# Patient Record
Sex: Female | Born: 1980 | Hispanic: No | Marital: Married | Smoking: Never smoker
Health system: Southern US, Community
[De-identification: ages and names within clinical notes are randomized; demographics above are authoritative.]

## PROBLEM LIST (undated history)

## (undated) ENCOUNTER — Inpatient Hospital Stay (HOSPITAL_COMMUNITY): Payer: Self-pay

## (undated) DIAGNOSIS — O24419 Gestational diabetes mellitus in pregnancy, unspecified control: Secondary | ICD-10-CM

## (undated) DIAGNOSIS — R002 Palpitations: Secondary | ICD-10-CM

## (undated) DIAGNOSIS — M419 Scoliosis, unspecified: Secondary | ICD-10-CM

## (undated) DIAGNOSIS — M545 Low back pain, unspecified: Secondary | ICD-10-CM

## (undated) DIAGNOSIS — F419 Anxiety disorder, unspecified: Secondary | ICD-10-CM

## (undated) DIAGNOSIS — G8929 Other chronic pain: Secondary | ICD-10-CM

## (undated) DIAGNOSIS — M5124 Other intervertebral disc displacement, thoracic region: Secondary | ICD-10-CM

## (undated) DIAGNOSIS — T8859XA Other complications of anesthesia, initial encounter: Secondary | ICD-10-CM

## (undated) HISTORY — DX: Low back pain, unspecified: M54.50

## (undated) HISTORY — DX: Other complications of anesthesia, initial encounter: T88.59XA

## (undated) HISTORY — DX: Palpitations: R00.2

## (undated) HISTORY — DX: Other chronic pain: G89.29

## (undated) HISTORY — DX: Anxiety disorder, unspecified: F41.9

## (undated) HISTORY — DX: Gestational diabetes mellitus in pregnancy, unspecified control: O24.419

## (undated) HISTORY — DX: Other intervertebral disc displacement, thoracic region: M51.24

## (undated) HISTORY — DX: Scoliosis, unspecified: M41.9

---

## 2021-01-24 DIAGNOSIS — Z113 Encounter for screening for infections with a predominantly sexual mode of transmission: Secondary | ICD-10-CM | POA: Insufficient documentation

## 2021-01-24 DIAGNOSIS — Z309 Encounter for contraceptive management, unspecified: Secondary | ICD-10-CM | POA: Insufficient documentation

## 2021-01-24 DIAGNOSIS — Z0289 Encounter for other administrative examinations: Secondary | ICD-10-CM | POA: Insufficient documentation

## 2021-01-24 HISTORY — DX: Encounter for screening for infections with a predominantly sexual mode of transmission: Z11.3

## 2021-01-25 ENCOUNTER — Other Ambulatory Visit: Payer: Self-pay | Admitting: Family Medicine

## 2021-01-25 ENCOUNTER — Other Ambulatory Visit: Payer: Self-pay

## 2021-01-25 ENCOUNTER — Other Ambulatory Visit (HOSPITAL_COMMUNITY)
Admission: RE | Admit: 2021-01-25 | Discharge: 2021-01-25 | Disposition: A | Discharge status: Home / Self Care | Payer: Medicaid Other | Source: Ambulatory Visit | Attending: Family Medicine | Admitting: Family Medicine

## 2021-01-25 ENCOUNTER — Ambulatory Visit (INDEPENDENT_AMBULATORY_CARE_PROVIDER_SITE_OTHER): Payer: Medicaid Other | Admitting: Family Medicine

## 2021-01-25 VITALS — BP 100/68 | HR 76 | Ht 68.0 in | Wt 141.6 lb

## 2021-01-25 DIAGNOSIS — R079 Chest pain, unspecified: Secondary | ICD-10-CM | POA: Diagnosis not present

## 2021-01-25 DIAGNOSIS — G8929 Other chronic pain: Secondary | ICD-10-CM | POA: Diagnosis not present

## 2021-01-25 DIAGNOSIS — Z113 Encounter for screening for infections with a predominantly sexual mode of transmission: Secondary | ICD-10-CM | POA: Diagnosis present

## 2021-01-25 DIAGNOSIS — Z114 Encounter for screening for human immunodeficiency virus [HIV]: Secondary | ICD-10-CM

## 2021-01-25 DIAGNOSIS — R002 Palpitations: Secondary | ICD-10-CM | POA: Insufficient documentation

## 2021-01-25 DIAGNOSIS — K5900 Constipation, unspecified: Secondary | ICD-10-CM | POA: Insufficient documentation

## 2021-01-25 DIAGNOSIS — M545 Low back pain, unspecified: Secondary | ICD-10-CM | POA: Insufficient documentation

## 2021-01-25 DIAGNOSIS — N6320 Unspecified lump in the left breast, unspecified quadrant: Secondary | ICD-10-CM

## 2021-01-25 DIAGNOSIS — Z1159 Encounter for screening for other viral diseases: Secondary | ICD-10-CM

## 2021-01-25 DIAGNOSIS — Z0289 Encounter for other administrative examinations: Secondary | ICD-10-CM | POA: Diagnosis not present

## 2021-01-25 HISTORY — DX: Unspecified lump in the left breast, unspecified quadrant: N63.20

## 2021-01-25 LAB — POCT URINALYSIS DIP (MANUAL ENTRY)
Bilirubin, UA: NEGATIVE
Glucose, UA: NEGATIVE mg/dL
Ketones, POC UA: NEGATIVE mg/dL
Nitrite, UA: NEGATIVE
Protein Ur, POC: NEGATIVE mg/dL
Spec Grav, UA: >=1.03 — AB (ref 1.010–1.025)
Urobilinogen, UA: 0.2 E.U./dL
pH, UA: 5 (ref 5.0–8.0)

## 2021-01-25 LAB — POCT URINE PREGNANCY: Preg Test, Ur: NEGATIVE

## 2021-01-25 LAB — POCT UA - MICROSCOPIC ONLY

## 2021-01-25 MED ORDER — IVERMECTIN 3 MG PO TABS: 200.0000 ug/kg | ORAL_TABLET | Freq: Every day | ORAL | 0 refills | Status: AC

## 2021-01-25 MED ORDER — POLYETHYLENE GLYCOL 3350 17 GM/SCOOP PO POWD
17.0000 g | Freq: Every day | ORAL | 0 refills | Status: DC
Start: 2021-01-25 — End: 2022-07-14

## 2021-01-25 MED ORDER — PRAZIQUANTEL 600 MG PO TABS: 2400.0000 mg | ORAL_TABLET | Freq: Once | ORAL | 0 refills | Status: AC

## 2021-01-25 MED ORDER — ACETAMINOPHEN 325 MG PO TABS: 650.0000 mg | ORAL_TABLET | Freq: Four times a day (QID) | ORAL | 1 refills | Status: DC | PRN

## 2021-01-25 NOTE — Assessment & Plan Note (Signed)
Acute. Endorses 2 month history of intermittent palpitation, chest heaviness, and dizziness that comes and goes and self resolves. EKG today with normal sinus rhythm. Labs obtained. Anxiety could be contributing given patient's history and endorsement of feeling "jumpy, nervous, watchful".  - Plan for GAD-7 or PHQ-SADS form at follow up - if persists can consider holter monitor  - follow up labs

## 2021-01-25 NOTE — Assessment & Plan Note (Signed)
Chronic. Diagnostic mammogram with ultrasound scheduled.

## 2021-01-25 NOTE — Assessment & Plan Note (Signed)
Chronic. No red flag symptoms. Normal strength. No current radiculopathy. - Tylenol PRN for pain  - can consider repeat imaging in future

## 2021-01-25 NOTE — Assessment & Plan Note (Signed)
Miralax QD. May increase to BID if needed

## 2021-01-25 NOTE — Patient Instructions (Addendum)
It was wonderful to see you today.  Please bring ALL of your medications with you to every visit.   Today we talked about:  - Welcome to our clinic! - We have scheduled a follow up appointment on 02/09/2021 with Dr Mauri Reading - We have scheduled you a follow-up breast imaging for your history of a left breast mass   Thank you for choosing [facility].   Please call [phone] with any questions about today's appointment.  Please be sure to schedule follow up at the front  desk before you leave today.   Burley Saver, MD  Family Medicine

## 2021-01-25 NOTE — Assessment & Plan Note (Signed)
Refugee labs obtained Ivermectin and Praziquantel provided 

## 2021-01-25 NOTE — Progress Notes (Signed)
Patient Name: Lauren Ellis Date of Birth: [birth year] Date of Visit: 01/25/21 PCP: No primary care provider on file.  Chief Complaint: refugee intake examination  The patient's preferred language is arabic. An interpreter was used for the entire visit.  Interpreter Name or ID: Jodene Nam    Subjective: Lauren Ellis is a pleasant 40 y.o. presenting today for an initial refugee and immigrant clinic visit.   ROS: swellings in scalp which is painful, chronic severe pain with diagnosis of disc herniation at L4-5, occasional recurring pain in left upper back/shoulder (aches when touches),  sense of breathlessness and occasionaly pain on left medial chest discomfort when she takes breath x 2 months,  Occasional palpitations both at rest and with exertion with occasional dizziness and chest dicomfort, self resolves, never had this before. Describes the chest pain as a "heaviness". Denies current symptoms. constipation x years Fatigue and low energy Denies cough, syncope, vomiting or diarrhea, bowel/bladder incontinence, saddle anesthesia.  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 Some radiculopathy to right leg during severe pain.  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 None In the past two weeks, have you had difficulty with obtaining food for your family? 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 Dentition with few cavities on molars, no acute infection. Appears well hydrated. Neck: Supple, no thyromegaly Cardiac: Regular rate and rhythm. Normal S1/S2. No murmurs, rubs, or gallops appreciated. Chest wall tendernes Breast: 3cm soft mobile nodule on left inferior medial breast around 7 ' oclock Lungs: Clear bilaterally to ascultation.  Abdomen: Normoactive bowel sounds. No tenderness to deep or light palpation. No rebound or guarding. No significant splenomegaly appreciated on exam Extremities: Warm, well perfused without edema.  Skin: 0.3x0.3 skin colored nodule on right medial occipital scalp, mildly tender to palpation, no surrounding erythema Psych: Pleasant and appropriate  MSK: 5/5 strength in UE and LE  EKG: NSR, possible Left atrial enlargement in in leads II and III, otherwise normal. QTC 425. HR 78.   Left  breast mass Chronic. Diagnostic mammogram with ultrasound scheduled.   Constipation Miralax QD. May  increase to BID if needed  Chronic midline low back pain Chronic. No red flag symptoms. Normal strength. No current radiculopathy. - Tylenol PRN for pain  - can consider repeat imaging in future  Palpitations Acute. Endorses 2 month history of intermittent palpitation, chest heaviness, and dizziness that comes and goes and self resolves. EKG today with normal sinus rhythm. Labs obtained. Anxiety could be contributing given patient's history and endorsement of feeling "jumpy, nervous, watchful".  - Plan for GAD-7 or PHQ-SADS form at follow up - if persists can consider holter monitor  - follow up labs  Encounter for health examination of refugee Refugee labs obtained Ivermectin and Praziquantel provided  I have personally updated the history tabs within Epic and included the refugee information in social documentation.   Designated Market researcher signed with agency   Release of information signed for Health Department   Return to care in 1 month in [facility] with resident physician and PCP  Vaccines: up to date on COVID vaccinations  Orpah Cobb, DO [facility], PGY3 01/25/2021 4:52 PM  I was available as preceptor in clinic. I agree with the assessment and plan as documented below.  Burley Saver, MD

## 2021-01-26 LAB — HEPATITIS B CORE ANTIBODY, TOTAL: Hep B Core Total Ab: NEGATIVE

## 2021-01-26 LAB — CBC WITH DIFFERENTIAL/PLATELET
Basophils Absolute: 0.1 10*3/uL (ref 0.0–0.2)
Basos: 1 %
EOS (ABSOLUTE): 0.2 10*3/uL (ref 0.0–0.4)
Eos: 4 %
Hematocrit: 38.7 % (ref 34.0–46.6)
Hemoglobin: 13.3 g/dL (ref 11.1–15.9)
Immature Grans (Abs): 0 10*3/uL (ref 0.0–0.1)
Immature Granulocytes: 0 %
Lymphocytes Absolute: 2.1 10*3/uL (ref 0.7–3.1)
Lymphs: 37 %
MCH: 30.8 pg (ref 26.6–33.0)
MCHC: 34.4 g/dL (ref 31.5–35.7)
MCV: 90 fL (ref 79–97)
Monocytes Absolute: 0.4 10*3/uL (ref 0.1–0.9)
Monocytes: 7 %
Neutrophils Absolute: 2.8 10*3/uL (ref 1.4–7.0)
Neutrophils: 51 %
Platelets: 264 10*3/uL (ref 150–450)
RBC: 4.32 x10E6/uL (ref 3.77–5.28)
RDW: 12.6 % (ref 11.7–15.4)
WBC: 5.5 10*3/uL (ref 3.4–10.8)

## 2021-01-26 LAB — COMPREHENSIVE METABOLIC PANEL
ALT: 14 IU/L (ref 0–32)
AST: 18 IU/L (ref 0–40)
Albumin/Globulin Ratio: 1.5 (ref 1.2–2.2)
Albumin: 4.6 g/dL (ref 3.8–4.8)
Alkaline Phosphatase: 95 IU/L (ref 44–121)
BUN/Creatinine Ratio: 22 (ref 9–23)
BUN: 15 mg/dL (ref 6–20)
Bilirubin Total: 0.3 mg/dL (ref 0.0–1.2)
CO2: 22 mmol/L (ref 20–29)
Calcium: 9.6 mg/dL (ref 8.7–10.2)
Chloride: 102 mmol/L (ref 96–106)
Creatinine, Ser: 0.69 mg/dL (ref 0.57–1.00)
Globulin, Total: 3.1 g/dL (ref 1.5–4.5)
Glucose: 86 mg/dL (ref 65–99)
Potassium: 4.2 mmol/L (ref 3.5–5.2)
Sodium: 139 mmol/L (ref 134–144)
Total Protein: 7.7 g/dL (ref 6.0–8.5)
eGFR: 114 mL/min/{1.73_m2} (ref >59)

## 2021-01-26 LAB — LIPID PANEL
Chol/HDL Ratio: 2.8 ratio (ref 0.0–4.4)
Cholesterol, Total: 171 mg/dL (ref 100–199)
HDL: 62 mg/dL (ref >39)
LDL Chol Calc (NIH): 102 mg/dL — ABNORMAL HIGH (ref 0–99)
Triglycerides: 28 mg/dL (ref 0–149)
VLDL Cholesterol Cal: 7 mg/dL (ref 5–40)

## 2021-01-26 LAB — HEPATITIS B SURFACE ANTIGEN: Hepatitis B Surface Ag: NEGATIVE

## 2021-01-26 LAB — URINE CYTOLOGY ANCILLARY ONLY
Chlamydia: NEGATIVE
Comment: NEGATIVE
Comment: NORMAL
Neisseria Gonorrhea: NEGATIVE

## 2021-01-26 LAB — VARICELLA ZOSTER ANTIBODY, IGG: Varicella zoster IgG: 1068 index (ref >165)

## 2021-01-26 LAB — HEPATITIS B SURFACE ANTIBODY, QUANTITATIVE: Hepatitis B Surf Ab Quant: 73.5 m[IU]/mL (ref >9.9)

## 2021-01-26 LAB — TSH: TSH: 2.94 u[IU]/mL (ref 0.450–4.500)

## 2021-01-26 LAB — HCV INTERPRETATION

## 2021-01-26 LAB — RPR: RPR Ser Ql: NONREACTIVE

## 2021-01-26 LAB — HCV AB W REFLEX TO QUANT PCR: HCV Ab: 0.1 s/co ratio (ref 0.0–0.9)

## 2021-01-26 LAB — HIV ANTIBODY (ROUTINE TESTING W REFLEX): HIV Screen 4th Generation wRfx: NONREACTIVE

## 2021-02-06 NOTE — Progress Notes (Deleted)
   Subjective:   Patient ID: Lauren Ellis    DOB: 1981-09-15, 40 y.o. female   MRN: [id number]  Lauren Ellis is a 40 y.o. female with a history of chronic low back pain, constipation, left breast mass, palpitations here for follow up  Current concerns: Back pain?  Palpitations: Acute. 2 month history of intermittent palpatations, chest heaviness, and dizziness that comes and goes and self resolves. Not associated with exertion. EKG at last visit normal. Labs normal. Felt anxiety could be contributing.  PHQ-9/SADS form *** GAD-7***  Review of Systems:  Per HPI.   Objective:   There were no vitals taken for this visit. Vitals and nursing note reviewed.  General: pleasant ***, sitting comfortably in exam chair, well nourished, well developed, in no acute distress with non-toxic appearance HEENT: normocephalic, atraumatic, moist mucous membranes, oropharynx clear without erythema or exudate, TM normal bilaterally  Neck: supple, non-tender without lymphadenopathy CV: regular rate and rhythm without murmurs, rubs, or gallops, no lower extremity edema, 2+ radial and pedal pulses bilaterally Lungs: clear to auscultation bilaterally with normal work of breathing on room air Resp: breathing comfortably on room air, speaking in full sentences Abdomen: soft, non-tender, non-distended, no masses or organomegaly palpable, normoactive bowel sounds Skin: warm, dry, no rashes or lesions Extremities: warm and well perfused, normal tone MSK: ROM grossly intact, strength intact, gait normal Neuro: Alert and oriented, speech normal  Assessment & Plan:   No problem-specific Assessment & Plan notes found for this encounter.  No orders of the defined types were placed in this encounter.  No orders of the defined types were placed in this encounter.   {    This will disappear when note is signed, click to select method of visit    :1}  Orpah Cobb, DO PGY-3,  [facility] 02/06/2021 1:17 PM

## 2021-02-09 ENCOUNTER — Ambulatory Visit: Payer: Self-pay | Admitting: Family Medicine

## 2021-02-13 NOTE — Progress Notes (Signed)
Subjective:   Patient ID: Lauren Ellis    DOB: Oct 10, 1980, 40 y.o. female   MRN: [id number]  [facility] is a 40 y.o. female with a history of chronic low back pain, constipation, left breast mass, palpitations here for follow up  Current concerns: Low Back pain: chronic. Slow onset low back pain along midline. 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 Some radiculopathy to right leg during severe pain." Notes that pain is intermittent but can last a few days. Laying down helps the pain, however when it is severe nothing helps. Currently treat pain with OTC meds (doesn't provide name of what he has tried) but no help. She treats the pain with rest and massage. She was referred to PT but was unable to go in the past.  Denies bladder or bowel incontinence Denies any prior trauma or injuries Has had 3 vaginal and 1 c-section in past. Denies any history of procedures to back. Did not receive epidural.   "Kidney Pain" - Bilateral flank pain x 1 week. Denies any hematuria. Dysuria x 1 week. Denies fevers or chills.   Review of Systems:  Per HPI.   Objective:   BP 110/65   Pulse 66   Ht 5' 9.49" (1.765 m)   Wt 141 lb 12.8 oz (64.3 kg)   BMI 20.65 kg/m  Vitals and nursing note reviewed.  General: pleasant thin middle aged woman, sitting comfortably in exam chair, well nourished, well developed, in no acute distress with non-toxic appearance Neck: supple, non-tender without lymphadenopathy, normal ROM Resp: breathing comfortably on room air, speaking in full sentences Skin: warm, dry Extremities: warm and well perfused, normal tone MSK:  gait normal, see below, no flank pain/CVA tenderness to palpation Neuro: Alert and oriented, speech normal  Back: No gross deformity, scoliosis. TTP along midline lumbar spine, no paraspinal tenderness. Also had some bony tenderness  along T7.  FROM but does have some pain with extension, rotation, and side bending Strength LEs 5/5 all muscle groups.   2+ MSRs in patellar and achilles tendons, equal bilaterally. Negative SLRs. Sensation intact to light touch bilaterally. Negative fabers.   Assessment & Plan:   Chronic midline low back pain Chronic. 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 No red flag symptoms. Bony tenderness along lumbar spine and at T7.  - X-ray of thoracic and lumbar spine - referral to PT - Ibuprofen and tylenol PRN - topical analgesics including rx for Lidocaine  Encounter for health examination of refugee Reviewed labs results today  Dysuria Endorsed dysuria and flank pain x 1 week. No systemic symptoms and no CVA tenderness to palpation. UA unremarkable except for trace bacteria, blood present but negative microscopy.  - empirically treat with Keflex  - urine culture   Orders Placed This Encounter  Procedures  . Urine Culture  . DG Lumbar Spine Complete    Standing Status:   Future    Standing Expiration Date:   02/14/2022    Order Specific Question:   Reason for Exam (SYMPTOM  OR DIAGNOSIS REQUIRED)    Answer:   chronic low back pain    Order Specific Question:   Is patient pregnant?    Answer:   Unknown (Please Explain)    Order Specific Question:   Preferred imaging location?    Answer:  [facility]  . DG Thoracic Spine 4V    Standing Status:   Future    Standing Expiration Date:   02/14/2022    Order Specific Question:   Reason for Exam (SYMPTOM  OR DIAGNOSIS REQUIRED)    Answer:   pain to palpation in mid thoracic    Order Specific Question:   Is patient pregnant?    Answer:   Unknown (Please Explain)    Order Specific Question:   Preferred imaging location?    Answer:   [facility]  . Ambulatory referral to Physical Therapy    Referral Priority:   Routine     Referral Type:   Physical Medicine    Referral Reason:   Specialty Services Required    Requested Specialty:   Physical Therapy    Number of Visits Requested:   1  . POCT urinalysis dipstick  . POCT UA - Microscopic Only   Meds ordered this encounter  Medications  . lidocaine (XYLOCAINE) 5 % ointment    Sig: Apply 1 application topically as needed.    Dispense:  240 g    Refill:  2  . cephALEXin (KEFLEX) 500 MG capsule    Sig: Take 1 capsule (500 mg total) by mouth 4 (four) times daily for 5 days.    Dispense:  20 capsule    Refill:  0   Due to language barrier, an interpreter was present during the history-taking and subsequent discussion (and for part of the physical exam) with this patient. In-person Arabic interpreter present.    Orpah Cobb, DO PGY-3, [facility] 02/16/2021 11:09 AM

## 2021-02-14 ENCOUNTER — Other Ambulatory Visit: Payer: Self-pay

## 2021-02-14 ENCOUNTER — Ambulatory Visit: Payer: Medicaid Other | Admitting: Family Medicine

## 2021-02-14 ENCOUNTER — Encounter: Payer: Self-pay | Admitting: Family Medicine

## 2021-02-14 VITALS — BP 110/65 | HR 66 | Ht 69.49 in | Wt 141.8 lb

## 2021-02-14 DIAGNOSIS — M545 Low back pain, unspecified: Secondary | ICD-10-CM | POA: Diagnosis not present

## 2021-02-14 DIAGNOSIS — R300 Dysuria: Secondary | ICD-10-CM

## 2021-02-14 DIAGNOSIS — Z0289 Encounter for other administrative examinations: Secondary | ICD-10-CM | POA: Diagnosis not present

## 2021-02-14 DIAGNOSIS — G8929 Other chronic pain: Secondary | ICD-10-CM | POA: Diagnosis not present

## 2021-02-14 LAB — POCT UA - MICROSCOPIC ONLY

## 2021-02-14 LAB — POCT URINALYSIS DIP (MANUAL ENTRY)
Bilirubin, UA: NEGATIVE
Glucose, UA: NEGATIVE mg/dL
Ketones, POC UA: NEGATIVE mg/dL
Leukocytes, UA: NEGATIVE
Nitrite, UA: NEGATIVE
Protein Ur, POC: NEGATIVE mg/dL
Spec Grav, UA: >=1.03 — AB (ref 1.010–1.025)
Urobilinogen, UA: 0.2 E.U./dL
pH, UA: 5 (ref 5.0–8.0)

## 2021-02-14 MED ORDER — LIDOCAINE 5 % EX OINT: 1.0000 "application " | TOPICAL_OINTMENT | CUTANEOUS | 2 refills | Status: DC | PRN

## 2021-02-14 MED ORDER — CEPHALEXIN 500 MG PO CAPS: 500.0000 mg | ORAL_CAPSULE | Freq: Four times a day (QID) | ORAL | 0 refills | Status: AC

## 2021-02-14 NOTE — Patient Instructions (Addendum)
        .               .            5 .                     .      .           .         Voltaren               .   I am treating you for a UTI based on your symptoms. Your urine test was negative so I am getting a culture to further evaluate. Please take the antibiotics four times a day for 5 days. Drink plenty of water  Please go get x-ray of your back at your earliest convenience I have referred you to physical therapy. Please expect a call to schedule this. In the mean time, you can treat your pain with tylenol or ibuprofen.       You can try voltaren gel to the area  I have also given you a prescription of lidocaine ointment that you can apply to the area.

## 2021-02-16 DIAGNOSIS — R300 Dysuria: Secondary | ICD-10-CM | POA: Insufficient documentation

## 2021-02-16 NOTE — Assessment & Plan Note (Signed)
Reviewed labs results today

## 2021-02-16 NOTE — Assessment & Plan Note (Signed)
Endorsed dysuria and flank pain x 1 week. No systemic symptoms and no CVA tenderness to palpation. UA unremarkable except for trace bacteria, blood present but negative microscopy.  - empirically treat with Keflex  - urine culture

## 2021-02-16 NOTE — Assessment & Plan Note (Signed)
Chronic. 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 No red flag symptoms. Bony tenderness along lumbar spine and at T7.  - X-ray of thoracic and lumbar spine - referral to PT - Ibuprofen and tylenol PRN - topical analgesics including rx for Lidocaine

## 2021-02-22 NOTE — Progress Notes (Signed)
   Subjective:   Patient ID: Lauren Ellis    DOB: May 31, 1981, 40 y.o. female   MRN: [id number]  HPI: Patient has a history of chronic low back pain here for review of back x-rays. She has been treating the pain with Lidocaine. She takes tylenol sometimes. These medicines help control her pain. She notes that pain is manageable and opts to wait on MRI or referral. She is looking forward to physical therapy   Best phone number: [phone]  Review of Systems:  Per HPI.   Objective:   BP 120/60   Pulse 64   Ht 5' 9.69" (1.77 m)   Wt 147 lb 12.8 oz (67 kg)   SpO2 99%   BMI 21.40 kg/m  Vitals and nursing note reviewed.  General: pleasant thin young woman, sitting comfortably in exam chair, well nourished, well developed, in no acute distress with non-toxic appearance Resp: breathing comfortably on room air, speaking in full sentences MSK:  gait normal Neuro: Alert and oriented, speech normal  Assessment & Plan:   Chronic midline low back pain Reviewed x-ray findings. Informed patient that her possible history of herniated disc would not be well visualized on x-ray, we would need to obtain MRI. Patient is interested in continuing conservative management at this time including PT. Recommended holding off on MRI until pain is not well controlled with conservative management and further treatment is desired.  She was amendable to this plan. - [facility] to help schedule patient with PT  - continue tylenol/ibuprofen and topical lidocaine  - follow up as needed  No orders of the defined types were placed in this encounter.  Meds ordered this encounter  Medications  . lidocaine (XYLOCAINE) 5 % ointment    Sig: Apply 1 application topically as needed.    Dispense:  240 g    Refill:  2   Due to language barrier, an interpreter was present during the history-taking and subsequent discussion (and for part of the physical exam) with this patient. Arabic #[id number]    Orpah Cobb, DO PGY-3, [facility] 02/27/2021 5:57 PM

## 2021-02-23 ENCOUNTER — Ambulatory Visit
Admission: RE | Admit: 2021-02-23 | Discharge: 2021-02-23 | Disposition: A | Discharge status: Home / Self Care | Payer: Medicaid Other | Source: Ambulatory Visit | Attending: Family Medicine | Admitting: Family Medicine

## 2021-02-23 ENCOUNTER — Other Ambulatory Visit: Payer: Self-pay | Admitting: Family Medicine

## 2021-02-23 ENCOUNTER — Other Ambulatory Visit: Payer: Self-pay

## 2021-02-23 DIAGNOSIS — M545 Low back pain, unspecified: Secondary | ICD-10-CM

## 2021-02-23 DIAGNOSIS — G8929 Other chronic pain: Secondary | ICD-10-CM

## 2021-02-25 ENCOUNTER — Other Ambulatory Visit: Payer: Self-pay

## 2021-02-25 ENCOUNTER — Ambulatory Visit: Payer: Medicaid Other | Admitting: Family Medicine

## 2021-02-25 ENCOUNTER — Encounter: Payer: Self-pay | Admitting: Family Medicine

## 2021-02-25 DIAGNOSIS — M545 Low back pain, unspecified: Secondary | ICD-10-CM

## 2021-02-25 DIAGNOSIS — G8929 Other chronic pain: Secondary | ICD-10-CM | POA: Diagnosis not present

## 2021-02-25 MED ORDER — LIDOCAINE 5 % EX OINT: 1.0000 "application " | TOPICAL_OINTMENT | CUTANEOUS | 2 refills | Status: DC | PRN

## 2021-02-25 NOTE — Patient Instructions (Signed)
Please expect a call from our office with your physical therapy appointment. The appointmetn will be located at             .    Address: [address] [place], [place], [place] [address]  Phone: [phone]   Please continue to take tylenol and use the Lidocaine ointment for pain as needed.            Marland Kitchen

## 2021-02-27 NOTE — Assessment & Plan Note (Signed)
Reviewed x-ray findings. Informed patient that her possible history of herniated disc would not be well visualized on x-ray, we would need to obtain MRI. Patient is interested in continuing conservative management at this time including PT. Recommended holding off on MRI until pain is not well controlled with conservative management and further treatment is desired.  She was amendable to this plan. - [facility] to help schedule patient with PT  - continue tylenol/ibuprofen and topical lidocaine  - follow up as needed

## 2021-03-07 ENCOUNTER — Emergency Department (HOSPITAL_COMMUNITY): Payer: Medicaid Other

## 2021-03-07 ENCOUNTER — Emergency Department (HOSPITAL_COMMUNITY)
Admission: EM | Admit: 2021-03-07 | Discharge: 2021-03-07 | Disposition: A | Discharge status: Home / Self Care | Payer: Medicaid Other | Attending: Emergency Medicine | Admitting: Emergency Medicine

## 2021-03-07 DIAGNOSIS — R531 Weakness: Secondary | ICD-10-CM | POA: Insufficient documentation

## 2021-03-07 DIAGNOSIS — R0602 Shortness of breath: Secondary | ICD-10-CM | POA: Diagnosis not present

## 2021-03-07 DIAGNOSIS — Z8616 Personal history of COVID-19: Secondary | ICD-10-CM | POA: Diagnosis not present

## 2021-03-07 DIAGNOSIS — M549 Dorsalgia, unspecified: Secondary | ICD-10-CM | POA: Diagnosis not present

## 2021-03-07 LAB — CBC WITH DIFFERENTIAL/PLATELET
Abs Immature Granulocytes: 0.01 10*3/uL (ref 0.00–0.07)
Basophils Absolute: 0 10*3/uL (ref 0.0–0.1)
Basophils Relative: 1 %
Eosinophils Absolute: 0.1 10*3/uL (ref 0.0–0.5)
Eosinophils Relative: 1 %
HCT: 40.2 % (ref 36.0–46.0)
Hemoglobin: 13.4 g/dL (ref 12.0–15.0)
Immature Granulocytes: 0 %
Lymphocytes Relative: 44 %
Lymphs Abs: 2.4 10*3/uL (ref 0.7–4.0)
MCH: 30.3 pg (ref 26.0–34.0)
MCHC: 33.3 g/dL (ref 30.0–36.0)
MCV: 91 fL (ref 80.0–100.0)
Monocytes Absolute: 0.5 10*3/uL (ref 0.1–1.0)
Monocytes Relative: 8 %
Neutro Abs: 2.5 10*3/uL (ref 1.7–7.7)
Neutrophils Relative %: 46 %
Platelets: 231 10*3/uL (ref 150–400)
RBC: 4.42 MIL/uL (ref 3.87–5.11)
RDW: 13 % (ref 11.5–15.5)
WBC: 5.5 10*3/uL (ref 4.0–10.5)
nRBC: 0 % (ref 0.0–0.2)

## 2021-03-07 LAB — BASIC METABOLIC PANEL
Anion gap: 7 (ref 5–15)
BUN: 14 mg/dL (ref 6–20)
CO2: 25 mmol/L (ref 22–32)
Calcium: 9.4 mg/dL (ref 8.9–10.3)
Chloride: 106 mmol/L (ref 98–111)
Creatinine, Ser: 0.67 mg/dL (ref 0.44–1.00)
GFR, Estimated: >60 mL/min (ref >60)
Glucose, Bld: 72 mg/dL (ref 70–99)
Potassium: 3.9 mmol/L (ref 3.5–5.1)
Sodium: 138 mmol/L (ref 135–145)

## 2021-03-07 LAB — TSH: TSH: 2.051 u[IU]/mL (ref 0.350–4.500)

## 2021-03-07 MED ORDER — SODIUM CHLORIDE 0.9 % IV BOLUS
1000.0000 mL | Freq: Once | INTRAVENOUS | Status: AC
  Administered 2021-03-07: 1000 mL via INTRAVENOUS

## 2021-03-07 MED ORDER — ALBUTEROL SULFATE HFA 108 (90 BASE) MCG/ACT IN AERS
2.0000 | INHALATION_SPRAY | RESPIRATORY_TRACT | Status: DC | PRN
  Administered 2021-03-07: 2 via RESPIRATORY_TRACT
  Filled 2021-03-07: qty 6.7

## 2021-03-07 NOTE — ED Triage Notes (Signed)
Difficulty breathing for three months but today is worse. No medication taken. Hx of COVID and seasonal allergies. Pt able to complete sentences.

## 2021-03-07 NOTE — ED Notes (Signed)
Interpreter # Ronny Flurry [phone]

## 2021-03-07 NOTE — Discharge Instructions (Signed)
You have been evaluated for your shortness of breath.  Fortunately xray of your chest is normal.  Your blood work is normal as well.  No evidence of anemia.  No evidence of infection.  Your thyroid function is normal.  You may use albuterol inhaler 2 puffs every 4 hours as needed for shortness of breath.  Call and follow up with your doctor for further care.    ?? ?????? ?? ??? ??? ?????? ????. ???? ???? ? ??? ?????? ??????? ????? ??? ?????. ??? ???? ????? ????. ?? ???? ??? ??? ????. ?? ???? ???? ??? ???????. ????? ????? ??????? ???? ??????. ????? ??????? ????? ????????? ????? ?? 4 ????? ??? ?????? ???? ??????. ???? ?????? ?????? ?????? ??? ???? ?? ???????. tama taqyimuk min 'ajl dayq altanafus ladayka. Jeanella Craze , fa'iina al'ashieat alsiyniat lisadrik 'amr tabieiun. eamal aldam tabieiun Anderson. la dalil ealaa faqr aldum. la yujad dalil ealaa al'iisabati. wazifat alghudat aldaraqiat ladayk tabieiatun. yumkinuk aistikhdam munshaqat 'albutirul maratayn kula 4 saeat hasab alhajat lidiq altanafusi. aitasal bitabibik watabieih lilhusul ealaa mazid min alrieayati.

## 2021-03-07 NOTE — ED Provider Notes (Signed)
[facility] EMERGENCY DEPARTMENT Provider Note   CSN: [id number] Arrival date & time: 03/07/21  1455     History Chief Complaint  Patient presents with  . Shortness of Breath    [name]  The history is provided by the patient and medical records. The history is limited by a language barrier. A language interpreter was used.     40 year old Arabic speaking female presenting complaining of shortness of breath.  History obtained through language interpreter.  Patient states since she was infected with COVID-19 infection last year she has never been the same.  She report for the past 3 to 4 months she has had recurrent shortness of breath.  She states she feels as if she cannot catch a full breath.  She endorsed some pain in her back, endorse generalized weakness.  Symptom has been waxing waning.  No associated fever chills no productive cough no congestion no abnormal bleeding no prior history of PE or DVT.  And she mention been evaluated for her symptoms by her PCP recently and was told that her EKG was normal.  She denies tobacco use and denies any significant cardiac history.  No history of anemia  Past Medical History:  Diagnosis Date  . Chronic lower back pain    occasional sciatica on right    Patient Active Problem List   Diagnosis Date Noted  . Dysuria 02/16/2021  . Left breast mass 01/25/2021  . Constipation 01/25/2021  . Chronic midline low back pain 01/25/2021  . Palpitations 01/25/2021  . Encounter for health examination of refugee 01/24/2021    The histories are not reviewed yet. Please review them in the "History" navigator section and refresh this SmartLink.   OB History   No obstetric history on file.     No family history on file.  Social History   Tobacco Use  . Smoking status: Never Smoker  . Smokeless tobacco: Never Used  Vaping Use  . Vaping Use: Never used  Substance Use Topics  .  Alcohol use: Never  . Drug use: Never    Home Medications Prior to Admission medications   Medication Sig Start Date End Date Taking? Authorizing Provider  acetaminophen (TYLENOL) 325 MG tablet Take 2 tablets (650 mg total) by mouth every 6 (six) hours as needed for moderate pain. 01/25/21   Mullis, Kiersten P, DO  lidocaine (XYLOCAINE) 5 % ointment Apply 1 application topically as needed. 02/25/21   Mullis, Kiersten P, DO  polyethylene glycol powder (GLYCOLAX/MIRALAX) 17 GM/SCOOP powder Take 17 g by mouth daily. 01/25/21   Mullis, Kiersten P, DO    Allergies    Patient has no known allergies.  Review of Systems   Review of Systems  All other systems reviewed and are negative.   Physical Exam Updated Vital Signs BP 110/73 (BP Location: Right Arm)   Pulse 62   Temp 98.2 F (36.8 C)   Resp 20   SpO2 100%   Physical Exam Vitals and nursing note reviewed.  Constitutional:      General: She is not in acute distress.    Appearance: She is well-developed.     Comments: Patient resting comfortably, speaking in complete sentences, appears to be in no acute respiratory distress.  HENT:     Head: Atraumatic.     Nose: Nose normal.     Mouth/Throat:     Mouth: Mucous membranes are moist.  Eyes:  Conjunctiva/sclera: Conjunctivae normal.  Cardiovascular:     Rate and Rhythm: Normal rate and regular rhythm.     Pulses: Normal pulses.     Heart sounds: Normal heart sounds.  Pulmonary:     Effort: Pulmonary effort is normal.     Breath sounds: Normal breath sounds. No wheezing, rhonchi or rales.  Abdominal:     Palpations: Abdomen is soft.     Tenderness: There is no abdominal tenderness.  Musculoskeletal:     Cervical back: Normal range of motion and neck supple.     Right lower leg: No edema.     Left lower leg: No edema.  Skin:    Findings: No rash.  Neurological:     Mental Status: She is alert and oriented to person, place, and time.  Psychiatric:        Mood and  Affect: Mood normal.     ED Results / Procedures / Treatments   Labs (all labs ordered are listed, but only abnormal results are displayed) Labs Reviewed  BASIC METABOLIC PANEL  CBC WITH DIFFERENTIAL/PLATELET  TSH    EKG None   Date: 03/07/2021  Rate: 63  Rhythm: normal sinus rhythm  QRS Axis: normal  Intervals: normal  ST/T Wave abnormalities: normal  Conduction Disutrbances: none  Narrative Interpretation:   Old EKG Reviewed: No significant changes noted     Radiology DG Chest 2 View  Result Date: 03/07/2021 CLINICAL DATA:  40 year old female with shortness of breath. EXAM: CHEST - 2 VIEW COMPARISON:  None. FINDINGS: The heart size and mediastinal contours are within normal limits. Both lungs are clear. The visualized skeletal structures are unremarkable. IMPRESSION: No active cardiopulmonary disease. Electronically Signed   By: Elgie Collard M.D.   On: 03/07/2021 16:10    Procedures Procedures   Medications Ordered in ED Medications  albuterol (VENTOLIN HFA) 108 (90 Base) MCG/ACT inhaler 2 puff (has no administration in time range)  sodium chloride 0.9 % bolus 1,000 mL (1,000 mLs Intravenous New Bag/Given 03/07/21 1729)    ED Course  I have reviewed the triage vital signs and the nursing notes.  Pertinent labs & imaging results that were available during my care of the patient were reviewed by me and considered in my medical decision making (see chart for details).    MDM Rules/Calculators/A&P                          BP (!) 95/57   Pulse 63   Temp 98.2 F (36.8 C)   Resp 18   Ht 5' 9.69" (1.77 m)   Wt 67 kg   SpO2 100%   BMI 21.39 kg/m   Final Clinical Impression(s) / ED Diagnoses Final diagnoses:  SOB (shortness of breath)    Rx / DC Orders ED Discharge Orders    None     3:25 PM Patient endorsed having recurrent shortness of breath for at least 3 to 4 months.  Had COVID infection last year.  Patient overall well-appearing and appears  to be in no acute distress comfortably, no respiratory discomfort, speaking in complete sentences, O2 sats at 100% on room air.  Will perform medical screening exam.  I have low suspicion for PE given the prolonged duration of her symptoms.  6:20 PM EKG unremarkable, labs are reassuring, chest x-ray is normal, vital signs stable.  No acute medical condition identified during this visit.  Patient discharged home with albuterol inhaler 2 puffs every  4 hours as needed for shortness of breath.  Outpatient follow-up with PCP recommended.   Fayrene Helper, PA-C 03/07/21 1821    Cheryll Cockayne, MD 03/11/21 [phone]

## 2021-03-15 ENCOUNTER — Ambulatory Visit: Payer: Self-pay

## 2021-03-21 ENCOUNTER — Other Ambulatory Visit: Payer: Self-pay

## 2021-03-21 ENCOUNTER — Ambulatory Visit
Admission: RE | Admit: 2021-03-21 | Discharge: 2021-03-21 | Disposition: A | Discharge status: Home / Self Care | Payer: Medicaid Other | Source: Ambulatory Visit | Attending: Family Medicine | Admitting: Family Medicine

## 2021-03-21 DIAGNOSIS — N6320 Unspecified lump in the left breast, unspecified quadrant: Secondary | ICD-10-CM

## 2021-04-05 ENCOUNTER — Ambulatory Visit: Payer: Medicaid Other | Attending: Family Medicine | Admitting: Physical Therapy

## 2021-04-05 ENCOUNTER — Other Ambulatory Visit: Payer: Self-pay

## 2021-04-05 ENCOUNTER — Encounter: Payer: Self-pay | Admitting: Physical Therapy

## 2021-04-05 DIAGNOSIS — M6281 Muscle weakness (generalized): Secondary | ICD-10-CM

## 2021-04-05 DIAGNOSIS — R2689 Other abnormalities of gait and mobility: Secondary | ICD-10-CM | POA: Diagnosis present

## 2021-04-05 DIAGNOSIS — M545 Low back pain, unspecified: Secondary | ICD-10-CM

## 2021-04-05 NOTE — Patient Instructions (Addendum)
Access Code: [email or web address] URL: [email or web address] Date: 04/05/2021 Prepared by: Alphonzo Severance  Exercises Supine Posterior Pelvic Tilt - 2 x daily - 7 x weekly - 2 sets - 10 reps - 5'' hold Seated Sciatic Tensioner - 2 x daily - 7 x weekly - 3 sets - 10 reps

## 2021-04-05 NOTE — Therapy (Addendum)
[facility] Outpatient Rehabilitation [facility] [address] [place], [place], [address] Phone: [phone]   Fax:  [phone]  Physical Therapy Evaluation  Patient Details  Name: Lauren Ellis MRN: [id number] Date of Birth: 05-01-1981 Referring Provider (PT): Latrelle Dodrill, MD  Encounter Date: 04/05/2021     PT End of Session - 04/06/21 1053     Visit Number 1    Number of Visits 12    Date for PT Re-Evaluation 06/01/21    Authorization Type MCD - Healthy blue    PT Start Time 1745    PT Stop Time 1830    PT Time Calculation (min) 45 min             Past Medical History:  Diagnosis Date   Chronic lower back pain    occasional sciatica on right                Past Surgical History:  Procedure Laterality Date   CESAREAN SECTION      There were no vitals filed for this visit.    Subjective Assessment - 04/05/21 1755     Subjective Lauren Ellis is a 40 y.o. female who presents to clinic with chief complaint of low back pain.  MOI/History of condition: Low back pain for 3-4 years, insidious onset, has flare ups and times of relative ease but never goes completely away.  Pain location: proximal sacrum/lower lumbbar with some intermittent n/t R lateral leg.  Red flags: denies bb changes, denies saddle anesthesia.  48 hour pain intensity:  highest 7/10, current 7/10, best 5/10.  Aggs: standing (immediate), walking, lifting (immediate).  Eases: massage, rest.  Nature: "burning and compression of bone"  Severity: moderate/high  Irritability: mod    Stage: chronic.  Stability: staying the same.  24 hour pattern: better in morning before activity.  Functional limitations/goals: pick up daughter without pain, reduce pain.  Home environment: lives at home with husband, no STE, no steps.  Assistive device: none.    Pertinent History possible lumbar disk prolapse 2018                [facility] PT Assessment -  04/06/21 0001       Assessment   Medical Diagnosis Chronic midline low back pain, unspecified whether sciatica present    Referring Provider (PT) Latrelle Dodrill, MD    Onset Date/Surgical Date 04/06/17    Hand Dominance Right    Next MD Visit unknown    Prior Therapy none      Precautions   Precaution Comments none      Restrictions   Other Position/Activity Restrictions none      Balance Screen   Has the patient fallen in the past 6 months No      Functional Tests   Functional tests Single leg stance;Other;Other2      Single Leg Stance   Comments <5'' bil      Other:   Other/ Comments Heel and toe walk WNL, P! with heel walk      Strength   Right Hip Flexion 4/5    Right Hip Extension 3/5    Right Hip ABduction 3+/5    Left Hip Flexion 4/5    Left Hip Extension 3/5    Left Hip ABduction 4/5    Right Knee Extension 4/5   w/ P!   Left Knee Extension 4/5      Flexibility   Soft Tissue Assessment /Muscle Length yes  Palpation   Spinal mobility pain at L4-S1 with PA      Special Tests   Other special tests SLR L WNL R limited to 45% (+), Slump (+) R                        Objective measurements completed on examination: See above findings.               PT Education - 04/06/21 1049     Education Details POC, diagnosis, prognosis, HEP.  Pt educated via explanation, demonstration, and handout (HEP).  Pt confirms understanding verbally.              PT Short Term Goals - 04/06/21 1105       PT SHORT TERM GOAL #1   Title [facility] will be >75% HEP compliant within 3 weeks to improve carryover between sessions and facilitate independent management of condition.    Target Date 04/27/21               PT Long Term Goals - 04/06/21 1105       PT LONG TERM GOAL #1   Title [facility] will report a 75% decrease in pain from evaluation  EVAL: 7/10 pain    Target Date 06/01/21       PT LONG TERM GOAL #2   Title [facility] will be able to lift 35 lbs from the floor and place on a 3 foot counter, not limited by pain  EVAL: unable    Target Date 06/01/21      PT LONG TERM GOAL #3   Title [facility] will improve the following MMTs to 4/5 to show improvement in strength:  hip abduction, hip extension  SEE FLOWSHEET FOR FIGURES ON EVAL    Target Date 06/01/21                    Plan - 04/06/21 1054     Clinical Impression Statement Lauren Ellis is a 40 y.o. female who presents to clinic with signs and sxs consistent with mechanical LBP secondary to possible chronic disk pathology.  (+) signs of R sided neural tension.  Presents as discogenic or articular in nature.  Pt presents with pain and impairments/deficits in: strength, balance, and ROM.  Activity limitations include: lifting, standing, ambulating.  Participation limitations include: community participation, lifting/playing with child, housework.  Pt will benefit from skilled therapy to address pain and the listed deficits in order to achieve functional goals, enable safety and independence in completion of daily tasks, and return to PLOF.    Stability/Clinical Decision Making Stable/Uncomplicated    Clinical Decision Making Low    Rehab Potential Good    PT Frequency 2x / week    PT Duration 6 weeks    PT Treatment/Interventions ADLs/Self Care Home Management;Aquatic Therapy;Iontophoresis 4mg /ml Dexamethasone;Traction;Gait training;Therapeutic activities;Therapeutic exercise;Neuromuscular re-education;Manual techniques;Dry needling;Spinal Manipulations;Joint Manipulations    PT Next Visit Plan progressing core and hip strenghening, lifting training, neural mobility    PT Home Exercise Plan [email or web address]    Consulted and Agree with Plan of Care Patient             Patient will benefit from skilled therapeutic intervention in order to improve  the following deficits and impairments:     Visit Diagnosis: Midline low back pain, unspecified chronicity, unspecified whether sciatica present - Plan: PT plan of care cert/re-cert  Muscle weakness - Plan: PT plan of care cert/re-cert  Balance problem - Plan: PT plan of care cert/re-cert     Problem List Patient Active Problem List   Diagnosis Date Noted   Dysuria 02/16/2021   Left breast mass 01/25/2021   Constipation 01/25/2021   Chronic midline low back pain 01/25/2021   Palpitations 01/25/2021   Encounter for health examination of refugee 01/24/2021    Lauren Ellis PT, DPT 04/06/21 11:20 AM   [facility] Outpatient Rehabilitation [facility] [address] [place], [place], [address] Phone: [phone]   Fax:  [phone]  Name: Lauren Ellis MRN: [id number] Date of Birth: 1981/10/06

## 2021-04-06 ENCOUNTER — Ambulatory Visit: Payer: Medicaid Other | Admitting: Physical Therapy

## 2021-04-12 ENCOUNTER — Other Ambulatory Visit: Payer: Self-pay

## 2021-04-12 ENCOUNTER — Ambulatory Visit: Payer: Medicaid Other | Attending: Family Medicine | Admitting: Rehabilitative and Restorative Service Providers"

## 2021-04-12 ENCOUNTER — Encounter: Payer: Self-pay | Admitting: Rehabilitative and Restorative Service Providers"

## 2021-04-12 DIAGNOSIS — R2689 Other abnormalities of gait and mobility: Secondary | ICD-10-CM | POA: Insufficient documentation

## 2021-04-12 DIAGNOSIS — M545 Low back pain, unspecified: Secondary | ICD-10-CM | POA: Diagnosis not present

## 2021-04-12 DIAGNOSIS — M6281 Muscle weakness (generalized): Secondary | ICD-10-CM | POA: Diagnosis present

## 2021-04-12 NOTE — Patient Instructions (Signed)
Advised pt, via interpreter, to only do prior issued HEP

## 2021-04-12 NOTE — Therapy (Signed)
[facility] Outpatient Rehabilitation [facility] [address] [place], [place], [address] Phone: [phone]   Fax:  [phone]  Physical Therapy Treatment  Patient Details  Name: Lauren Ellis MRN: [id number] Date of Birth: [birth year] Referring Provider (PT): Latrelle Dodrill, MD   Encounter Date: 04/12/2021   PT End of Session - 04/12/21 1429     Visit Number 2    Number of Visits 12    Date for PT Re-Evaluation 06/01/21    Authorization Type MCD - Healthy blue    PT Start Time 0157    PT Stop Time 0240    PT Time Calculation (min) 43 min    Activity Tolerance Patient tolerated treatment well;No increased pain    Behavior During Therapy WFL for tasks assessed/performed             Past Medical History:  Diagnosis Date   Chronic lower back pain    occasional sciatica on right    Past Surgical History:  Procedure Laterality Date   CESAREAN SECTION      There were no vitals filed for this visit.   Subjective Assessment - 04/12/21 1400     Subjective The pain is better than when I came the first time.    Currently in Pain? Yes    Pain Score 4     Pain Location Back    Pain Orientation Mid    Pain Descriptors / Indicators Dull    Pain Type Chronic pain    Pain Radiating Towards R LE to knee                               [facility] Adult PT Treatment/Exercise - 04/12/21 0001       Exercises   Exercises Lumbar;Knee/Hip      Lumbar Exercises: Standing   Other Standing Lumbar Exercises tilt with hamstring curls x 15 each; tilt with hip ext x 10 each      Lumbar Exercises: Seated   Other Seated Lumbar Exercises sciatic seated SLR with slump HEP review; slump with LAQ x 20 with each LE      Lumbar Exercises: Supine   Other Supine Lumbar Exercises tilt x 15, tilt with march x 15, tilt with SLR x 10 each side; ktc 2x30 sec each side    Other Supine Lumbar Exercises Hamstring stretch each side with ankle pump  2x20      Lumbar Exercises: Quadruped   Other Quadruped Lumbar Exercises prayer stretch with elbows bent (modified) 3x30 sec                      PT Short Term Goals - 04/12/21 1424       PT SHORT TERM GOAL #1   Title [facility] will be >75% HEP compliant within 3 weeks to improve carryover between sessions and facilitate independent management of condition.    Status On-going               PT Long Term Goals - 04/06/21 1105       PT LONG TERM GOAL #1   Title [facility] will report a 75% decrease in pain from evaluation  EVAL: 7/10 pain    Target Date 06/01/21      PT LONG TERM GOAL #2   Title [facility] will be able to lift 35 lbs from the floor and  place on a 3 foot counter, not limited by pain  EVAL: unable    Target Date 06/01/21      PT LONG TERM GOAL #3   Title [facility] will improve the following MMTs to 4/5 to show improvement in strength:  hip abduction, hip extension  SEE FLOWSHEET FOR FIGURES ON EVAL    Target Date 06/01/21                   Plan - 04/12/21 1412     Clinical Impression Statement pt late for appt at 152. Pt continues to report midline LBP which radiates into her R LE. She is able to perform tilt therex with assist from PT and interpreter for technique. Pt would continue to benefit from PT for lumbar flexibility and strengthenening therex, body mechanics, and neural therex as needed R LE. Interpreter RASHA [phone]    PT Treatment/Interventions ADLs/Self Care Home Management;Aquatic Therapy;Iontophoresis 4mg /ml Dexamethasone;Traction;Gait training;Therapeutic activities;Therapeutic exercise;Neuromuscular re-education;Manual techniques;Dry needling;Spinal Manipulations;Joint Manipulations    PT Next Visit Plan progressing core and hip strenghening, lifting training, neural mobility    Consulted and Agree with Plan of Care Patient              Patient will benefit from skilled therapeutic intervention in order to improve the following deficits and impairments:     Visit Diagnosis: Midline low back pain, unspecified chronicity, unspecified whether sciatica present  Muscle weakness  Balance problem     Problem List Patient Active Problem List   Diagnosis Date Noted   Dysuria 02/16/2021   Left breast mass 01/25/2021   Constipation 01/25/2021   Chronic midline low back pain 01/25/2021   Palpitations 01/25/2021   Encounter for health examination of refugee 01/24/2021    01/26/2021, PT, DPT 04/12/2021, 4:54 PM  [facility] Outpatient Rehabilitation [facility] [address] [place], [place], [place] Phone: [phone]   Fax:  [phone]  Name: Lauren Ellis MRN: [name] Date of Birth: [birth year]

## 2021-04-15 ENCOUNTER — Ambulatory Visit: Payer: Medicaid Other | Admitting: Physical Therapy

## 2021-04-18 ENCOUNTER — Ambulatory Visit: Payer: Medicaid Other

## 2021-04-20 ENCOUNTER — Other Ambulatory Visit: Payer: Self-pay

## 2021-04-20 ENCOUNTER — Ambulatory Visit: Payer: Medicaid Other

## 2021-04-20 DIAGNOSIS — M545 Low back pain, unspecified: Secondary | ICD-10-CM | POA: Diagnosis not present

## 2021-04-20 DIAGNOSIS — M6281 Muscle weakness (generalized): Secondary | ICD-10-CM

## 2021-04-20 NOTE — Therapy (Signed)
[facility] Outpatient Rehabilitation [facility] [address] [place], [place], [address] Phone: [phone]   Fax:  [phone]  Physical Therapy Treatment  Patient Details  Name: Lauren Ellis MRN: [id number] Date of Birth: 1981-09-23 Referring Provider (PT): Latrelle Dodrill, MD   Encounter Date: 04/20/2021   PT End of Session - 04/20/21 1745     Visit Number 3    Number of Visits 12    Date for PT Re-Evaluation 06/01/21    Authorization Type MCD - Healthy blue    Authorization Time Period 7/5-8/20/22    Authorization - Visit Number 2    Authorization - Number of Visits 12    PT Start Time 1745    PT Stop Time 1827    PT Time Calculation (min) 42 min    Activity Tolerance Patient tolerated treatment well;No increased pain    Behavior During Therapy WFL for tasks assessed/performed             Past Medical History:  Diagnosis Date   Chronic lower back pain    occasional sciatica on right    Past Surgical History:  Procedure Laterality Date   CESAREAN SECTION      There were no vitals filed for this visit.   Subjective Assessment - 04/20/21 1746     Subjective Patient reports having some back pain currently.    Patient is accompained by: Interpreter    Currently in Pain? Yes    Pain Score 5     Pain Location Back    Pain Orientation Mid;Lower    Pain Descriptors / Indicators Burning;Pressure    Pain Type Chronic pain    Pain Radiating Towards RLE    Aggravating Factors  household chores, walking    Pain Relieving Factors nothing                [facility] PT Assessment - 04/20/21 0001       AROM   Overall AROM Comments pain with lumbar flexion and extension along Left low back                           [facility] Adult PT Treatment/Exercise - 04/20/21 0001       Self-Care   Self-Care Other Self-Care Comments    Other Self-Care Comments  see patient education      Lumbar Exercises: Stretches    Lower Trunk Rotation 60 seconds    Other Lumbar Stretch Exercise open book 1 x 10 each      Lumbar Exercises: Supine   Pelvic Tilt 10 reps    Pelvic Tilt Limitations posterior pelvic tilt    Bent Knee Raise 10 reps    Bent Knee Raise Limitations x2      Lumbar Exercises: Sidelying   Clam 10 reps    Clam Limitations x2; blue band      Manual Therapy   Manual therapy comments STM/DTM bilateral thoracolumbar paraspinals                    PT Education - 04/20/21 1809     Education Details Issued tennis ball for self soft tissue mobilization. Updated HEP    Person(s) Educated Patient    Methods Explanation;Demonstration;Tactile cues;Verbal cues;Handout    Comprehension Verbalized understanding;Returned demonstration;Verbal cues required              PT Short Term Goals - 04/12/21 1424  PT SHORT TERM GOAL #1   Title [facility] will be >75% HEP compliant within 3 weeks to improve carryover between sessions and facilitate independent management of condition.    Status On-going               PT Long Term Goals - 04/06/21 1105       PT LONG TERM GOAL #1   Title [facility] will report a 75% decrease in pain from evaluation  EVAL: 7/10 pain    Target Date 06/01/21      PT LONG TERM GOAL #2   Title [facility] will be able to lift 35 lbs from the floor and place on a 3 foot counter, not limited by pain  EVAL: unable    Target Date 06/01/21      PT LONG TERM GOAL #3   Title [facility] will improve the following MMTs to 4/5 to show improvement in strength:  hip abduction, hip extension  SEE FLOWSHEET FOR FIGURES ON EVAL    Target Date 06/01/21                   Plan - 04/20/21 1811     Clinical Impression Statement Patient noted to have tautness and palpable tenderness about bilateral thoracic paraspinals with partial release from manual therapy. Issued tennis  ball for patient to complete self soft tissue mobilization. Continued with core and hip strengthening and began trunk mobility with patient tolerating session well. She quickly fatigues with targeted hip extensors and abducto strengthening.    PT Treatment/Interventions ADLs/Self Care Home Management;Aquatic Therapy;Iontophoresis 4mg /ml Dexamethasone;Traction;Gait training;Therapeutic activities;Therapeutic exercise;Neuromuscular re-education;Manual techniques;Dry needling;Spinal Manipulations;Joint Manipulations    PT Next Visit Plan progressing core and hip strenghening, lifting training, neural mobility    PT Home Exercise Plan [email or web address]    Consulted and Agree with Plan of Care Patient             Patient will benefit from skilled therapeutic intervention in order to improve the following deficits and impairments:     Visit Diagnosis: Midline low back pain, unspecified chronicity, unspecified whether sciatica present  Muscle weakness     Problem List Patient Active Problem List   Diagnosis Date Noted   Dysuria 02/16/2021   Left breast mass 01/25/2021   Constipation 01/25/2021   Chronic midline low back pain 01/25/2021   Palpitations 01/25/2021   Encounter for health examination of refugee 01/24/2021   01/26/2021, PT, DPT, ATC 04/20/21 7:07 PM   [facility] [address] [place], [place], [place] Phone: [phone]   Fax:  [phone]  Name: Lauren Ellis MRN: [name] Date of Birth: 1981/05/19

## 2021-04-25 ENCOUNTER — Ambulatory Visit: Payer: Medicaid Other

## 2021-04-25 ENCOUNTER — Other Ambulatory Visit: Payer: Self-pay

## 2021-04-25 DIAGNOSIS — M545 Low back pain, unspecified: Secondary | ICD-10-CM

## 2021-04-25 DIAGNOSIS — M6281 Muscle weakness (generalized): Secondary | ICD-10-CM

## 2021-04-25 NOTE — Therapy (Signed)
[facility] Outpatient Rehabilitation [facility] [address] [place], [place], [address] Phone: [phone]   Fax:  [phone]  Physical Therapy Treatment  Patient Details  Name: Lauren Ellis MRN: [id number] Date of Birth: 1981-07-18 Referring Provider (PT): Latrelle Dodrill, MD   Encounter Date: 04/25/2021   PT End of Session - 04/25/21 1620     Visit Number 4    Number of Visits 12    Date for PT Re-Evaluation 06/01/21    Authorization Type MCD - Healthy blue    Authorization Time Period 7/5-8/20/22    Authorization - Visit Number 3    Authorization - Number of Visits 12    PT Start Time 1620    PT Stop Time 1701    PT Time Calculation (min) 41 min    Activity Tolerance Patient tolerated treatment well    Behavior During Therapy [facility] for tasks assessed/performed             Past Medical History:  Diagnosis Date   Chronic lower back pain    occasional sciatica on right    Past Surgical History:  Procedure Laterality Date   CESAREAN SECTION      There were no vitals filed for this visit.   Subjective Assessment - 04/25/21 1624     Subjective Patient reports the pain in the low back is very light, still having some pain in the mid back.    Patient is accompained by: Interpreter    Currently in Pain? Yes    Pain Score 6     Pain Location Back    Pain Orientation Mid;Left    Pain Descriptors / Indicators Tightness    Pain Type Chronic pain                               [facility] Adult PT Treatment/Exercise - 04/25/21 0001       Self-Care   Other Self-Care Comments  see patient education      Lumbar Exercises: Stretches   Other Lumbar Stretch Exercise childs pose 60 sec      Lumbar Exercises: Standing   Shoulder Extension 10 reps    Theraband Level (Shoulder Extension) Level 2 (Red)    Shoulder Extension Limitations x2      Lumbar Exercises: Seated   Other Seated Lumbar Exercises TA march 2 x  10      Lumbar Exercises: Supine   Dead Bug 10 reps    Dead Bug Limitations x2; LE only      Lumbar Exercises: Quadruped   Madcat/Old Horse 20 reps    Other Quadruped Lumbar Exercises thread the needle 1 x 10 each      Knee/Hip Exercises: Sidelying   Hip ABduction 10 reps    Hip ABduction Limitations x2      Manual Therapy   Manual therapy comments STM/DTM to left thoracic paraspinals                    PT Education - 04/25/21 1705     Education Details Updated HEP.    Person(s) Educated Patient    Methods Explanation;Demonstration;Verbal cues;Handout    Comprehension Verbalized understanding;Returned demonstration;Verbal cues required              PT Short Term Goals - 04/25/21 1636       PT SHORT TERM GOAL #1   Title [facility] will be >75%  HEP compliant within 3 weeks to improve carryover between sessions and facilitate independent management of condition.    Status Achieved               PT Long Term Goals - 04/06/21 1105       PT LONG TERM GOAL #1   Title [facility] will report a 75% decrease in pain from evaluation  EVAL: 7/10 pain    Target Date 06/01/21      PT LONG TERM GOAL #2   Title [facility] will be able to lift 35 lbs from the floor and place on a 3 foot counter, not limited by pain  EVAL: unable    Target Date 06/01/21      PT LONG TERM GOAL #3   Title [facility] will improve the following MMTs to 4/5 to show improvement in strength:  hip abduction, hip extension  SEE FLOWSHEET FOR FIGURES ON EVAL    Target Date 06/01/21                   Plan - 04/25/21 1644     Clinical Impression Statement Patient arrives with pain localized to Lt mid thoracic region and is noted to have tautness and palpable tenderness about Lt thoracic paraspinals. She reported feeling less tight following manual therapy and trunk mobility ther ex. Able to  progress dynamic core stabilization with patient reporting muscle fatigue, though no increase in pain. Cues required for pacing of all ther ex. She reported a reduction in her back pain at end of session rated as 4/10.    PT Treatment/Interventions ADLs/Self Care Home Management;Aquatic Therapy;Iontophoresis 4mg /ml Dexamethasone;Traction;Gait training;Therapeutic activities;Therapeutic exercise;Neuromuscular re-education;Manual techniques;Dry needling;Spinal Manipulations;Joint Manipulations    PT Next Visit Plan progressing core and hip strenghening, lifting training, neural mobility    PT Home Exercise Plan [id number]    Consulted and Agree with Plan of Care Patient             Patient will benefit from skilled therapeutic intervention in order to improve the following deficits and impairments:     Visit Diagnosis: Midline low back pain, unspecified chronicity, unspecified whether sciatica present  Muscle weakness     Problem List Patient Active Problem List   Diagnosis Date Noted   Dysuria 02/16/2021   Left breast mass 01/25/2021   Constipation 01/25/2021   Chronic midline low back pain 01/25/2021   Palpitations 01/25/2021   Encounter for health examination of refugee 01/24/2021   01/26/2021, PT, DPT, ATC 04/25/21 5:07 PM   [facility] Rehabilitation [facility] [address] [place], [place], [place] Phone: [phone]   Fax:  [phone]  Name: Lauren Ellis MRN: [name] Date of Birth: 14-Sep-1981

## 2021-04-27 ENCOUNTER — Other Ambulatory Visit: Payer: Self-pay

## 2021-04-27 ENCOUNTER — Ambulatory Visit: Payer: Medicaid Other

## 2021-04-27 DIAGNOSIS — M545 Low back pain, unspecified: Secondary | ICD-10-CM

## 2021-04-27 DIAGNOSIS — M6281 Muscle weakness (generalized): Secondary | ICD-10-CM

## 2021-04-27 DIAGNOSIS — R2689 Other abnormalities of gait and mobility: Secondary | ICD-10-CM

## 2021-04-27 NOTE — Therapy (Signed)
[facility] Outpatient Rehabilitation [facility] [address] [place], [place], [address] Phone: [phone]   Fax:  [phone]  Physical Therapy Treatment  Patient Details  Name: Lauren Ellis MRN: [id number] Date of Birth: 18-Feb-1981 Referring Provider (PT): Latrelle Dodrill, MD   Encounter Date: 04/27/2021   PT End of Session - 04/27/21 1659     Visit Number 5    Number of Visits 12    Date for PT Re-Evaluation 06/01/21    Authorization Type MCD - Healthy blue    Authorization Time Period 7/5-8/20/22    Authorization - Visit Number 4    Authorization - Number of Visits 12    PT Start Time 1700    PT Stop Time 1740    PT Time Calculation (min) 40 min    Activity Tolerance Patient tolerated treatment well    Behavior During Therapy [facility] for tasks assessed/performed             Past Medical History:  Diagnosis Date   Chronic lower back pain    occasional sciatica on right    Past Surgical History:  Procedure Laterality Date   CESAREAN SECTION      There were no vitals filed for this visit.   Subjective Assessment - 04/27/21 1702     Subjective Pt presents to PT with reports of continued mid and lower back pain. She notes that she had increased pain yesterday after cleaning and performing activities around the house. She has been compliant with HEP with no adverse effect. Pt is ready to beign PT at this time.    Patient is accompained by: Interpreter   Tobi Bastos [phone]   Currently in Pain? Yes    Pain Score 5     Pain Location Back    Pain Orientation Mid;Lower                               [facility] Adult PT Treatment/Exercise - 04/27/21 0001       Lumbar Exercises: Stretches   Double Knee to Chest Stretch 60 seconds    Other Lumbar Stretch Exercise childs pose 2x30 sec      Lumbar Exercises: Standing   Other Standing Lumbar Exercises pallof press 2x10 7lbs    Other Standing Lumbar Exercises foam roller  up wall for mid thoracic stretch x 5 - 5 sec hold      Lumbar Exercises: Supine   Dead Bug Limitations 2x10 UE/LE    Other Supine Lumbar Exercises supine thoracic ext over soft foam 2x15      Lumbar Exercises: Quadruped   Madcat/Old Horse 20 reps      Shoulder Exercises: Standing   Extension Limitations 2x10w/ scap retrac blue tband    Row Limitations 3x10 blue tband                    PT Education - 04/27/21 1755     Education Details HEP    Person(s) Educated Patient    Methods Explanation;Demonstration              PT Short Term Goals - 04/25/21 1636       PT SHORT TERM GOAL #1   Title [facility] will be >75% HEP compliant within 3 weeks to improve carryover between sessions and facilitate independent management of condition.    Status Achieved  PT Long Term Goals - 04/06/21 1105       PT LONG TERM GOAL #1   Title [facility] will report a 75% decrease in pain from evaluation  EVAL: 7/10 pain    Target Date 06/01/21      PT LONG TERM GOAL #2   Title [facility] will be able to lift 35 lbs from the floor and place on a 3 foot counter, not limited by pain  EVAL: unable    Target Date 06/01/21      PT LONG TERM GOAL #3   Title [facility] will improve the following MMTs to 4/5 to show improvement in strength:  hip abduction, hip extension  SEE FLOWSHEET FOR FIGURES ON EVAL    Target Date 06/01/21                   Plan - 04/27/21 1756     Clinical Impression Statement Pt was able to complete prescribed exercises with no adverse effect. She demonstrates flexion stretch preference for lower back, with decreased pain noted during DKTC stretch. HEP updated to include DKTC ford decreasing pain. Pt is progressing well with PT thus far and continues to benefit from skilled services. Will continue to progress as able.    PT Treatment/Interventions  ADLs/Self Care Home Management;Aquatic Therapy;Iontophoresis 4mg /ml Dexamethasone;Traction;Gait training;Therapeutic activities;Therapeutic exercise;Neuromuscular re-education;Manual techniques;Dry needling;Spinal Manipulations;Joint Manipulations    PT Next Visit Plan progressing core and hip strenghening, lifting training, neural mobility    PT Home Exercise Plan [id number]             Patient will benefit from skilled therapeutic intervention in order to improve the following deficits and impairments:     Visit Diagnosis: Midline low back pain, unspecified chronicity, unspecified whether sciatica present  Muscle weakness  Balance problem     Problem List Patient Active Problem List   Diagnosis Date Noted   Dysuria 02/16/2021   Left breast mass 01/25/2021   Constipation 01/25/2021   Chronic midline low back pain 01/25/2021   Palpitations 01/25/2021   Encounter for health examination of refugee 01/24/2021    01/26/2021, PT, DPT 04/27/21 6:21 PM  [facility] Outpatient Rehabilitation [facility] [address] [place], [place], [place] Phone: [phone]   Fax:  [phone]  Name: Lauren Ellis MRN: [name] Date of Birth: 1981-05-23

## 2021-05-02 ENCOUNTER — Telehealth: Payer: Self-pay

## 2021-05-02 ENCOUNTER — Ambulatory Visit: Payer: Medicaid Other

## 2021-05-02 NOTE — Telephone Encounter (Signed)
Unable to leave voicemail regarding missed PT appointment.  

## 2021-05-04 ENCOUNTER — Other Ambulatory Visit: Payer: Self-pay

## 2021-05-04 ENCOUNTER — Ambulatory Visit: Payer: Medicaid Other

## 2021-05-04 DIAGNOSIS — M6281 Muscle weakness (generalized): Secondary | ICD-10-CM

## 2021-05-04 DIAGNOSIS — R2689 Other abnormalities of gait and mobility: Secondary | ICD-10-CM

## 2021-05-04 DIAGNOSIS — M545 Low back pain, unspecified: Secondary | ICD-10-CM

## 2021-05-04 NOTE — Patient Instructions (Signed)

## 2021-05-04 NOTE — Therapy (Signed)
[facility] Outpatient Rehabilitation [facility] [address] [place], [place], [address] Phone: [phone]   Fax:  [phone]  Physical Therapy Treatment  Patient Details  Name: Lauren Ellis MRN: [id number] Date of Birth: Sep 21, 1981 Referring Provider (PT): Latrelle Dodrill, MD   Encounter Date: 05/04/2021   PT End of Session - 05/04/21 1747     Visit Number 6    Number of Visits 12    Date for PT Re-Evaluation 06/01/21    Authorization Type MCD - Healthy blue    Authorization Time Period 7/5-8/20/22    Authorization - Visit Number 5    Authorization - Number of Visits 12    PT Start Time 1747    PT Stop Time 1828    PT Time Calculation (min) 41 min    Activity Tolerance Patient tolerated treatment well    Behavior During Therapy [facility] for tasks assessed/performed             Past Medical History:  Diagnosis Date   Chronic lower back pain    occasional sciatica on right    Past Surgical History:  Procedure Laterality Date   CESAREAN SECTION      There were no vitals filed for this visit.   Subjective Assessment - 05/04/21 1750     Subjective Patient reports yesterday she felt really bad pain in her lower back without known cause. There is still pain today, but better than yesterday. She reports compliance with HEP.    Patient is accompained by: Interpreter   HDQQI [id number]   Currently in Pain? Yes    Pain Score 5     Pain Location Back    Pain Orientation Lower    Pain Descriptors / Indicators Pressure    Pain Type Chronic pain    Pain Onset More than a month ago    Pain Frequency Intermittent    Aggravating Factors  household chores    Pain Relieving Factors rest                [facility] PT Assessment - 05/04/21 0001       AROM   Overall AROM Comments lumbar AROM WNL pain with flexion and extension, worse with flexion                           [facility] Adult PT Treatment/Exercise - 05/04/21 0001        Self-Care   Other Self-Care Comments  see patient education      Lumbar Exercises: Standing   Functional Squats 10 reps    Functional Squats Limitations x2; with BUE support      Lumbar Exercises: Seated   Other Seated Lumbar Exercises hip hinge with dowel 1 x 10    Other Seated Lumbar Exercises pelvic tilt 2 x 10      Lumbar Exercises: Prone   Other Prone Lumbar Exercises prone pressup 2 x 10    Other Prone Lumbar Exercises prone on elbows 3 minutes                    PT Education - 05/04/21 1814     Education Details Updated HEP, education on proper posture, body mechanics with lifting,bending, and sleep positioning.    Person(s) Educated Patient    Methods Explanation;Demonstration;Verbal cues;Handout    Comprehension Verbalized understanding;Returned demonstration;Verbal cues required              PT Short  Term Goals - 04/25/21 1636       PT SHORT TERM GOAL #1   Title [facility] will be >75% HEP compliant within 3 weeks to improve carryover between sessions and facilitate independent management of condition.    Status Achieved               PT Long Term Goals - 04/06/21 1105       PT LONG TERM GOAL #1   Title [facility] will report a 75% decrease in pain from evaluation  EVAL: 7/10 pain    Target Date 06/01/21      PT LONG TERM GOAL #2   Title [facility] will be able to lift 35 lbs from the floor and place on a 3 foot counter, not limited by pain  EVAL: unable    Target Date 06/01/21      PT LONG TERM GOAL #3   Title [facility] will improve the following MMTs to 4/5 to show improvement in strength:  hip abduction, hip extension  SEE FLOWSHEET FOR FIGURES ON EVAL    Target Date 06/01/21                   Plan - 05/04/21 1801     Clinical Impression Statement Patient reports an increase in low back pain yesterday without known cause. She has  good lumbar AROM, though pain reported with both flexion and extension (worse with flexion) at beginning of session. Trialed extension baised movement with patient reporting a reduction in pain while performing prone pressup that returned to baseline after completion. With prone on elbows she reported an abolishment of pain. Her pain did quickly return when she assumed seated position. Able to introduce squatting and hip hinging with patient demonstrating good form and minor pain reported with seated hip hinge.    PT Treatment/Interventions ADLs/Self Care Home Management;Aquatic Therapy;Iontophoresis 4mg /ml Dexamethasone;Traction;Gait training;Therapeutic activities;Therapeutic exercise;Neuromuscular re-education;Manual techniques;Dry needling;Spinal Manipulations;Joint Manipulations    PT Next Visit Plan progressing core and hip strenghening, lifting training, neural mobility    PT Home Exercise Plan [id number]    Consulted and Agree with Plan of Care Patient             Patient will benefit from skilled therapeutic intervention in order to improve the following deficits and impairments:     Visit Diagnosis: Midline low back pain, unspecified chronicity, unspecified whether sciatica present  Muscle weakness  Balance problem     Problem List Patient Active Problem List   Diagnosis Date Noted   Dysuria 02/16/2021   Left breast mass 01/25/2021   Constipation 01/25/2021   Chronic midline low back pain 01/25/2021   Palpitations 01/25/2021   Encounter for health examination of refugee 01/24/2021   01/26/2021, PT, DPT, ATC 05/04/21 7:21 PM   [facility] Outpatient Rehabilitation [facility] [address] [place], [place], [place] Phone: [phone]   Fax:  [phone]  Name: Dangela How MRN: [name] Date of Birth: 08/05/1981

## 2021-05-09 ENCOUNTER — Telehealth: Payer: Self-pay

## 2021-05-09 ENCOUNTER — Ambulatory Visit: Payer: Medicaid Other | Attending: Family Medicine

## 2021-05-09 DIAGNOSIS — M545 Low back pain, unspecified: Secondary | ICD-10-CM | POA: Insufficient documentation

## 2021-05-09 DIAGNOSIS — M6281 Muscle weakness (generalized): Secondary | ICD-10-CM | POA: Insufficient documentation

## 2021-05-09 DIAGNOSIS — R2689 Other abnormalities of gait and mobility: Secondary | ICD-10-CM | POA: Insufficient documentation

## 2021-05-09 NOTE — Telephone Encounter (Signed)
Patient was unable to make it to her appointment today due to pain. Reviewed attendance policy and requested that patient call to cancel/reschedule if she is unable to make it. Confirmed next scheduled visit.

## 2021-05-11 ENCOUNTER — Other Ambulatory Visit: Payer: Self-pay

## 2021-05-11 ENCOUNTER — Ambulatory Visit: Payer: Medicaid Other

## 2021-05-11 DIAGNOSIS — M545 Low back pain, unspecified: Secondary | ICD-10-CM | POA: Diagnosis present

## 2021-05-11 DIAGNOSIS — R2689 Other abnormalities of gait and mobility: Secondary | ICD-10-CM | POA: Diagnosis present

## 2021-05-11 DIAGNOSIS — M6281 Muscle weakness (generalized): Secondary | ICD-10-CM

## 2021-05-11 NOTE — Therapy (Signed)
[facility] Outpatient Rehabilitation [facility] [address] [place], [place], [address] Phone: [phone]   Fax:  [phone]  Physical Therapy Treatment  Patient Details  Name: Lauren Ellis MRN: [id number] Date of Birth: Nov 30, 1980 Referring Provider (PT): Latrelle Dodrill, MD   Encounter Date: 05/11/2021   PT End of Session - 05/11/21 1739     Visit Number 7    Number of Visits 12    Date for PT Re-Evaluation 06/01/21    Authorization Type MCD - Healthy blue    Authorization Time Period 7/5-8/20/22    Authorization - Visit Number 6    Authorization - Number of Visits 12    PT Start Time 1746    PT Stop Time 1829    PT Time Calculation (min) 43 min    Activity Tolerance Patient tolerated treatment well    Behavior During Therapy [facility] for tasks assessed/performed             Past Medical History:  Diagnosis Date   Chronic lower back pain    occasional sciatica on right    Past Surgical History:  Procedure Laterality Date   CESAREAN SECTION      There were no vitals filed for this visit.   Subjective Assessment - 05/11/21 1746     Subjective Patient reports her back is a little better. She woke up the other day with a severe spasm and the pain when down to her Rt foot and it was difficult for her to move. This has happened previously, but this is the first time she has experienced this in awhile.    Patient is accompained by: Interpreter   May [id number]   Currently in Pain? Yes    Pain Score 3     Pain Location Back    Pain Orientation Lower    Pain Descriptors / Indicators Pressure    Pain Type Chronic pain    Pain Onset More than a month ago    Pain Frequency Intermittent    Aggravating Factors  household chores    Pain Relieving Factors rest                [facility] PT Assessment - 05/11/21 0001       Strength   Right Hip ABduction 4/5    Left Hip ABduction 4+/5                            [facility] Adult PT Treatment/Exercise - 05/11/21 0001       Self-Care   Other Self-Care Comments  see patient education      Lumbar Exercises: Supine   Dead Bug 10 reps    Dead Bug Limitations x2    Bridge with clamshell 10 reps   x2; blue band     Lumbar Exercises: Sidelying   Other Sidelying Lumbar Exercises hip circles CW/CCW 2 x 10 bilateral      Lumbar Exercises: Prone   Other Prone Lumbar Exercises prone pressup 1 x 10      Lumbar Exercises: Quadruped   Straight Leg Raise 10 reps    Straight Leg Raises Limitations x2; bilateral    Other Quadruped Lumbar Exercises fire hydrant 2 x 10      Knee/Hip Exercises: Machines for Strengthening   Total Gym Leg Press 2 x 10 @ 40 lbs  PT Education - 05/11/21 1826     Education Details Updated HEP    Person(s) Educated Patient    Methods Explanation;Demonstration;Tactile cues;Verbal cues;Handout    Comprehension Verbalized understanding;Returned demonstration;Verbal cues required;Tactile cues required              PT Short Term Goals - 04/25/21 1636       PT SHORT TERM GOAL #1   Title [facility] will be >75% HEP compliant within 3 weeks to improve carryover between sessions and facilitate independent management of condition.    Status Achieved               PT Long Term Goals - 04/06/21 1105       PT LONG TERM GOAL #1   Title [facility] will report a 75% decrease in pain from evaluation  EVAL: 7/10 pain    Target Date 06/01/21      PT LONG TERM GOAL #2   Title [facility] will be able to lift 35 lbs from the floor and place on a 3 foot counter, not limited by pain  EVAL: unable    Target Date 06/01/21      PT LONG TERM GOAL #3   Title [facility] will improve the following MMTs to 4/5 to show improvement in strength:  hip abduction, hip extension  SEE FLOWSHEET FOR FIGURES ON EVAL    Target Date  06/01/21                   Plan - 05/11/21 1757     Clinical Impression Statement Patient arrives with mild low back pain. Her hip abductor strength has improved compared to initially evaluation, though weakness still present. Able to begin core stabilization in quadruped with patient challenged in maintaining neutral spine with single leg hip extension. Able to complete leg press with patient requiring initial cues for appropriate LE alignment and to engage core. She tolerated session well today without reports of increased pain.    PT Treatment/Interventions ADLs/Self Care Home Management;Aquatic Therapy;Iontophoresis 4mg /ml Dexamethasone;Traction;Gait training;Therapeutic activities;Therapeutic exercise;Neuromuscular re-education;Manual techniques;Dry needling;Spinal Manipulations;Joint Manipulations    PT Next Visit Plan progressing core and hip strenghening, lifting training, neural mobility    PT Home Exercise Plan [id number]    Consulted and Agree with Plan of Care Patient             Patient will benefit from skilled therapeutic intervention in order to improve the following deficits and impairments:     Visit Diagnosis: Midline low back pain, unspecified chronicity, unspecified whether sciatica present  Muscle weakness     Problem List Patient Active Problem List   Diagnosis Date Noted   Dysuria 02/16/2021   Left breast mass 01/25/2021   Constipation 01/25/2021   Chronic midline low back pain 01/25/2021   Palpitations 01/25/2021   Encounter for health examination of refugee 01/24/2021   01/26/2021, PT, DPT, ATC 05/11/21 6:31 PM   [facility] Outpatient Rehabilitation [facility] [address] [place], [place], [place] Phone: [phone]   Fax:  [phone]  Name: Dominique Ressel MRN: [name] Date of Birth: 1981/08/14

## 2021-05-16 ENCOUNTER — Ambulatory Visit: Payer: Medicaid Other

## 2021-05-16 ENCOUNTER — Other Ambulatory Visit: Payer: Self-pay

## 2021-05-16 DIAGNOSIS — M545 Low back pain, unspecified: Secondary | ICD-10-CM | POA: Diagnosis not present

## 2021-05-16 DIAGNOSIS — M6281 Muscle weakness (generalized): Secondary | ICD-10-CM

## 2021-05-16 NOTE — Therapy (Signed)
[facility] Outpatient Rehabilitation [facility] [address] [place], [place], [address] Phone: [phone]   Fax:  [phone]  Physical Therapy Treatment  Patient Details  Name: Lauren Ellis MRN: [id number] Date of Birth: Apr 19, 1981 Referring Provider (PT): Latrelle Dodrill, MD   Encounter Date: 05/16/2021   PT End of Session - 05/16/21 1431     Visit Number 8    Number of Visits 12    Date for PT Re-Evaluation 06/01/21    Authorization Type MCD - Healthy blue    Authorization Time Period 7/5-8/20/22    Authorization - Visit Number 7    Authorization - Number of Visits 12    PT Start Time 1433   patient late   PT Stop Time 1500    PT Time Calculation (min) 27 min    Activity Tolerance Patient tolerated treatment well    Behavior During Therapy [facility] for tasks assessed/performed             Past Medical History:  Diagnosis Date   Chronic lower back pain    occasional sciatica on right    Past Surgical History:  Procedure Laterality Date   CESAREAN SECTION      There were no vitals filed for this visit.   Subjective Assessment - 05/16/21 1432     Subjective Patient reports increased pain in her mid back, but her low back pain comes and goes. She reports compliance with HEP.    Patient is accompained by: Interpreter   [name] [id number]   Currently in Pain? Yes    Pain Score 6     Pain Location Back    Pain Orientation Mid    Pain Descriptors / Indicators Other (Comment)   stretching   Pain Type Chronic pain    Pain Onset More than a month ago    Pain Frequency Constant    Aggravating Factors  household activity, standing    Pain Relieving Factors rest                [facility] PT Assessment - 05/16/21 0001       AROM   Overall AROM Comments thoracic flexion and extension WNL, though pain in mid/low back at end range                           [facility] Adult PT Treatment/Exercise - 05/16/21 0001        Lumbar Exercises: Stretches   Other Lumbar Stretch Exercise open book 1 x 10 each      Lumbar Exercises: Seated   Other Seated Lumbar Exercises repeated thoracic extension in sitting 1 x 10    Other Seated Lumbar Exercises shoulder extension on stability ball 2 x 15 yellow band; marching on stability ball 2  x10      Lumbar Exercises: Quadruped   Straight Leg Raise 10 reps    Opposite Arm/Leg Raise Limitations attempted ;unable to maintain stability                      PT Short Term Goals - 04/25/21 1636       PT SHORT TERM GOAL #1   Title [facility] will be >75% HEP compliant within 3 weeks to improve carryover between sessions and facilitate independent management of condition.    Status Achieved               PT Long Term Goals -  04/06/21 1105       PT LONG TERM GOAL #1   Title [facility] will report a 75% decrease in pain from evaluation  EVAL: 7/10 pain    Target Date 06/01/21      PT LONG TERM GOAL #2   Title [facility] will be able to lift 35 lbs from the floor and place on a 3 foot counter, not limited by pain  EVAL: unable    Target Date 06/01/21      PT LONG TERM GOAL #3   Title [facility] will improve the following MMTs to 4/5 to show improvement in strength:  hip abduction, hip extension  SEE FLOWSHEET FOR FIGURES ON EVAL    Target Date 06/01/21                   Plan - 05/16/21 1440     Clinical Impression Statement Session was limited as patient was late for her scheduled appointment. Her pain continues to fluctuate between the thoracic and lumbar region with patient reporting more pain along the Lt thoracic region upon arrival today. Able to progress core stabilization with patient requiring cues to maintain neutral spine as she has tendency to compensate with anterior pelvic tilt during core strengthening on stability ball. Attempted bird dog,  though patient unable to maintain lumbopelvic stability, so this exercise was discontinued. Overall good tolerance to today's session without reports of increased pain.    PT Treatment/Interventions ADLs/Self Care Home Management;Aquatic Therapy;Iontophoresis 4mg /ml Dexamethasone;Traction;Gait training;Therapeutic activities;Therapeutic exercise;Neuromuscular re-education;Manual techniques;Dry needling;Spinal Manipulations;Joint Manipulations    PT Next Visit Plan progressing core and hip strenghening, lifting training, neural mobility    PT Home Exercise Plan [id number]    Consulted and Agree with Plan of Care Patient             Patient will benefit from skilled therapeutic intervention in order to improve the following deficits and impairments:     Visit Diagnosis: Midline low back pain, unspecified chronicity, unspecified whether sciatica present  Muscle weakness     Problem List Patient Active Problem List   Diagnosis Date Noted   Dysuria 02/16/2021   Left breast mass 01/25/2021   Constipation 01/25/2021   Chronic midline low back pain 01/25/2021   Palpitations 01/25/2021   Encounter for health examination of refugee 01/24/2021   01/26/2021, PT, DPT, ATC 05/16/21 3:21 PM  [facility] Outpatient Rehabilitation [facility] [address] [place], [place], [place] Phone: [phone]   Fax:  [phone]  Name: Lauren Ellis MRN: [name] Date of Birth: 10/05/1981

## 2021-05-19 ENCOUNTER — Encounter: Payer: Self-pay | Admitting: Physical Therapy

## 2021-05-19 ENCOUNTER — Other Ambulatory Visit: Payer: Self-pay

## 2021-05-19 ENCOUNTER — Ambulatory Visit: Payer: Medicaid Other | Admitting: Physical Therapy

## 2021-05-19 DIAGNOSIS — M545 Low back pain, unspecified: Secondary | ICD-10-CM

## 2021-05-19 DIAGNOSIS — M6281 Muscle weakness (generalized): Secondary | ICD-10-CM

## 2021-05-19 DIAGNOSIS — R2689 Other abnormalities of gait and mobility: Secondary | ICD-10-CM

## 2021-05-19 NOTE — Therapy (Signed)
[facility] Outpatient Rehabilitation [facility] [address] [place], [place], [address] Phone: [phone]   Fax:  [phone]  Physical Therapy Treatment  Patient Details  Name: Lauren Ellis MRN: [id number] Date of Birth: December 08, 1980 Referring Provider (PT): Latrelle Dodrill, MD   Encounter Date: 05/19/2021   PT End of Session - 05/19/21 1400     Visit Number 9    Number of Visits 12    Date for PT Re-Evaluation 06/01/21    Authorization Type MCD - Healthy blue    Authorization Time Period 7/5-8/20/22    Authorization - Visit Number 9    Authorization - Number of Visits 12    Activity Tolerance Patient tolerated treatment well    Behavior During Therapy [facility] for tasks assessed/performed             Past Medical History:  Diagnosis Date   Chronic lower back pain    occasional sciatica on right    Past Surgical History:  Procedure Laterality Date   CESAREAN SECTION      There were no vitals filed for this visit.   Subjective Assessment - 05/19/21 1406     Subjective Pt reports that she has good days and bad days, currenlty her pain is 4/10 in the low back. aggs:  standing and doing housework  eases: sitting down    Patient is accompained by: Interpreter   Ipad interpreter   Pertinent History possible lumbar disk prolapse 2018    Pain Onset More than a month ago             [facility] Adult PT Treatment/Exercise:  Therapeutic Exercise, for core strengthening to reduce back pain and improve function in standing and lifting activities; pt cued for form and pacing to maximize therapeutic benefit:  - nu-step L6 5 min - bridge - 3x10 - lower trunk rotation - 20x - pilates ring squeeze (hip adduction) - 5'' hold 10x - alternating SLR from foam roller - 2x10 ea - Alternating supine clam with green TB - 2x10 - bird dog - 3x6 with 5'' hold     PT Short Term Goals - 04/25/21 1636       PT SHORT TERM GOAL #1   Title [facility] will be >75% HEP compliant within 3 weeks to improve carryover between sessions and facilitate independent management of condition.    Status Achieved               PT Long Term Goals - 04/06/21 1105       PT LONG TERM GOAL #1   Title [facility] will report a 75% decrease in pain from evaluation  EVAL: 7/10 pain    Target Date 06/01/21      PT LONG TERM GOAL #2   Title [facility] will be able to lift 35 lbs from the floor and place on a 3 foot counter, not limited by pain  EVAL: unable    Target Date 06/01/21      PT LONG TERM GOAL #3   Title [facility] will improve the following MMTs to 4/5 to show improvement in strength:  hip abduction, hip extension  SEE FLOWSHEET FOR FIGURES ON EVAL    Target Date 06/01/21                   Plan - 05/19/21 1420     Clinical Impression Statement Pt reports no increase in  baseline pain following therapy  HEP was reviewed, but left unchanged    Overall, [facility] is progressing fair with therapy.  Today we concentrated on core strengthening and hip strengthening.  Pt shows high level of fatigue with bridge and hip exercise showing continued deficit in core and hip strength which will continue to be addressed.  Poor stability with birddog w/ high level of fatigue.  Pt will continue to benefit from skilled physical therapy to address remaining deficits and achieve listed goals.  Continue per POC.    PT Treatment/Interventions ADLs/Self Care Home Management;Aquatic Therapy;Iontophoresis 4mg /ml Dexamethasone;Traction;Gait training;Therapeutic activities;Therapeutic exercise;Neuromuscular re-education;Manual techniques;Dry needling;Spinal Manipulations;Joint Manipulations    PT Next Visit Plan progressing core and hip strenghening, lifting training, neural mobility    PT Home Exercise Plan [id number]    Consulted and Agree with Plan  of Care Patient             Patient will benefit from skilled therapeutic intervention in order to improve the following deficits and impairments:  Pain  Visit Diagnosis: Midline low back pain, unspecified chronicity, unspecified whether sciatica present  Muscle weakness  Balance problem     Problem List Patient Active Problem List   Diagnosis Date Noted   Dysuria 02/16/2021   Left breast mass 01/25/2021   Constipation 01/25/2021   Chronic midline low back pain 01/25/2021   Palpitations 01/25/2021   Encounter for health examination of refugee 01/24/2021    01/26/2021 PT, DPT 05/19/21 2:45 PM  [facility] Outpatient Rehabilitation [facility] [address] [place], [place], [place] Phone: [phone]   Fax:  [phone]  Name: Lauren Ellis MRN: [name] Date of Birth: 1981-09-20

## 2021-05-24 ENCOUNTER — Ambulatory Visit: Payer: Medicaid Other

## 2021-05-24 ENCOUNTER — Other Ambulatory Visit: Payer: Self-pay

## 2021-05-24 DIAGNOSIS — M6281 Muscle weakness (generalized): Secondary | ICD-10-CM

## 2021-05-24 DIAGNOSIS — M545 Low back pain, unspecified: Secondary | ICD-10-CM

## 2021-05-24 NOTE — Therapy (Signed)
[facility] Lauren Ellis Ellis [address] [place], [place], [address] Phone: [phone]   Fax:  [phone]  Physical Therapy Treatment  Patient Details  Name: Lauren Ellis MRN: [id number] Date of Birth: November 28, 1980 Referring Provider (PT): Lauren Dodrill, MD   Encounter Date: 05/24/2021   PT End of Session - 05/24/21 1609     Visit Number 10    Number of Visits 12    Date for PT Re-Evaluation 06/01/21    Authorization Type MCD - Healthy blue    Authorization Time Period 7/5-8/20/22    Authorization - Visit Number 10    Authorization - Number of Visits 12    PT Start Time 1615    PT Stop Time 1659    PT Time Calculation (min) 44 min    Activity Tolerance Patient tolerated treatment well    Behavior During Therapy [facility] for tasks assessed/performed             Past Medical History:  Diagnosis Date   Chronic lower back pain    occasional sciatica on right    Past Surgical History:  Procedure Laterality Date   CESAREAN SECTION      There were no vitals filed for this visit.   Subjective Assessment - 05/24/21 1615     Subjective Patient reports she is feeling good and her back is feeling a little better. She reports compliance with HEP.    Patient is accompained by: Interpreter   [phone]   Pertinent History possible lumbar disk prolapse 2018    Currently in Pain? Yes    Pain Score 2     Pain Location Back    Pain Orientation Lower    Pain Descriptors / Indicators Pressure    Pain Type Chronic pain    Pain Onset More than a month ago    Pain Frequency Constant    Aggravating Factors  standing, household activity    Pain Relieving Factors rest                               [facility] Adult PT Treatment/Exercise - 05/24/21 0001       Therapeutic Activites    Therapeutic Activities Lifting    Lifting KB lifts from 8 inch step 5# 2 x 10      Lumbar Exercises: Supine   Dead Bug 10  reps    Dead Bug Limitations x2; with stability ball    Single Leg Bridge 10 reps    Bridge with Lauren Ellis Limitations x2; bilateral      Lumbar Exercises: Sidelying   Other Sidelying Lumbar Exercises sideplank 2x30 sec bilateral      Knee/Hip Exercises: Machines for Strengthening   Hip Cybex flexion 2 x 10 @ 25 lbs      Knee/Hip Exercises: Sidelying   Other Sidelying Knee/Hip Exercises sidelying leg taps 1 x 10 bilateral                      PT Short Term Goals - 04/25/21 1636       PT SHORT TERM GOAL #1   Title Lauren Ellis will be >75% HEP compliant within 3 weeks to improve carryover between sessions and facilitate independent management of condition.    Status Achieved               PT Long Term Goals - 04/06/21 1105  PT LONG TERM GOAL #1   Title Lauren Ellis will report a 75% decrease in pain from evaluation  EVAL: 7/10 pain    Target Date 06/01/21      PT LONG TERM GOAL #2   Title Lauren Ellis will be able to lift 35 lbs from the floor and place on a 3 foot counter, not limited by pain  EVAL: unable    Target Date 06/01/21      PT LONG TERM GOAL #3   Title Lauren Ellis will improve the following MMTs to 4/5 to show improvement in strength:  hip abduction, hip extension  SEE FLOWSHEET FOR FIGURES ON EVAL    Target Date 06/01/21                   Plan - 05/24/21 1627     Clinical Impression Statement Patient arrives with minimal low back pain today. Continued with progression of hip/core strengthening with patient overall tolerating session well today. Able to complete resisted hip flexion on cybex with patient maintaining good lumbopelvic stability. Introduced sideplank on elbow with patient requiring cues to decrease excessive lumbar lordosis with ability to correct once cued. Began addressing lifting mechanics with patient requiring cues for proper  sequencing of squatting and to limit excessive lumbar lordosis when she assumes standing posture. She is able to sequence the movement properly with continued practice,though unable to correct L-spine positioning even with heavy verbal,tactile, and visual cues. She reported low back and hip pain at end of session describing it as a strain, though unable to decipher if this was an increase from her baseline pain or if this was muscle soreness attributed to targeting strengthening of these areas.    PT Treatment/Interventions ADLs/Self Care Home Management;Aquatic Therapy;Iontophoresis 4mg /ml Dexamethasone;Traction;Gait training;Therapeutic activities;Therapeutic exercise;Neuromuscular re-education;Manual techniques;Dry needling;Spinal Manipulations;Joint Manipulations    PT Next Visit Plan re-eval    PT Home Exercise Plan [id number]    Consulted and Agree with Plan of Care Patient             Patient will benefit from skilled therapeutic intervention in order to improve the following deficits and impairments:  Pain  Visit Diagnosis: Midline low back pain, unspecified chronicity, unspecified whether sciatica present  Muscle weakness     Problem List Patient Active Problem List   Diagnosis Date Noted   Dysuria 02/16/2021   Left breast mass 01/25/2021   Constipation 01/25/2021   Chronic midline low back pain 01/25/2021   Palpitations 01/25/2021   Encounter for Ellis examination of refugee 01/24/2021   01/26/2021, PT, DPT, ATC 05/24/21 5:05 PM   [facility] Out[facility] [address] [place], [place], [place] Phone: [phone]   Fax:  [phone]  Name: Lauren Ellis MRN: [name] Date of Birth: Jan 19, 1981

## 2021-05-25 ENCOUNTER — Ambulatory Visit: Payer: Medicaid Other

## 2021-05-25 DIAGNOSIS — M545 Low back pain, unspecified: Secondary | ICD-10-CM

## 2021-05-25 DIAGNOSIS — M6281 Muscle weakness (generalized): Secondary | ICD-10-CM

## 2021-05-25 NOTE — Therapy (Addendum)
[place], [place], [address] Phone: [phone]   Fax:  [phone]  Physical Therapy Treatment/Re-evaluation/Discharge  Patient Details  Name: Lauren Ellis MRN: [id number] Date of Birth: Mar 24, 1981 Referring Provider (PT): Leeanne Rio, MD   Encounter Date: 05/25/2021   PT End of Session - 05/25/21 1750     Visit Number 11    Number of Visits 12    Date for PT Re-Evaluation 06/01/21    Authorization Type MCD - Healthy blue    Authorization Time Period 7/5-8/20/22    Authorization - Visit Number 11    Authorization - Number of Visits 12    PT Start Time 1750    PT Stop Time 1815    PT Time Calculation (min) 25 min    Activity Tolerance Patient tolerated treatment well    Behavior During Therapy [facility] for tasks assessed/performed             Past Medical History:  Diagnosis Date   Chronic lower back pain    occasional sciatica on right    Past Surgical History:  Procedure Laterality Date   CESAREAN SECTION      There were no vitals filed for this visit.   Subjective Assessment - 05/25/21 1751     Subjective Patient reports she is feeling good today. She reported after yesterday's session she was sore in her muscles and she has a little pain right now in her back. She reports overall her pain has reduced as prior to therapy she was experiencing constant high levels of pain and is now having moments of low pain levels about the low back.    Patient is accompained by: Interpreter   [phone]   Pertinent History possible lumbar disk prolapse 2018    Currently in Pain? Yes    Pain Score 2     Pain Location Back    Pain Orientation Lower    Pain Descriptors / Indicators Tightness    Pain Type Chronic pain    Pain Onset More than a month ago    Pain Frequency Constant    Aggravating Factors  prolonged standing, household activity    Pain Relieving Factors rest, massage                 [facility] PT Assessment - 05/25/21 0001       Functional Tests   Functional tests Other      Other:   Other/ Comments lifting 35 lbs from floor to counter height      AROM   Overall AROM Comments lumber AROM WNL all planes, though LBP at end range of all movements      Strength   Right Hip Flexion 4/5    Right Hip Extension 3+/5    Right Hip ABduction 4-/5    Left Hip Flexion 4+/5    Left Hip Extension 3+/5    Left Hip ABduction 4/5    Right Knee Flexion 5/5    Right Knee Extension 5/5    Left Knee Flexion 5/5    Left Knee Extension 5/5                           [facility] Adult PT Treatment/Exercise - 05/25/21 0001       Self-Care   Other Self-Care Comments  see patient education  PT Education - 05/25/21 1836     Education Details Education on re-assessment findings, D/C education, reviewed HEP, recommendation to f/u with referring provider due to ongoing back pain.    Person(s) Educated Patient    Methods Explanation    Comprehension Verbalized understanding              PT Short Term Goals - 04/25/21 1636       PT SHORT TERM GOAL #1   Title [facility] will be >75% HEP compliant within 3 weeks to improve carryover between sessions and facilitate independent management of condition.    Status Achieved               PT Long Term Goals - 05/25/21 1801       PT LONG TERM GOAL #1   Title [facility] will report a 75% decrease in pain from evaluation  EVAL: 7/10 pain    Baseline reports pain at worst 9-10/10 with prolonged standing and household activities.    Status Not Met      PT LONG TERM GOAL #2   Title [facility] will be able to lift 35 lbs from the floor and place on a 3 foot counter, not limited by pain  EVAL: unable    Baseline able to lift with proper form without inceased pain    Status Achieved      PT LONG TERM  GOAL #3   Title [facility] will improve the following MMTs to 4/5 to show improvement in strength:  hip abduction, hip extension  SEE FLOWSHEET FOR FIGURES ON EVAL    Status Partially Met                   Plan - 05/25/21 1817     Clinical Impression Statement Patient has attended 11 PT sessions since start of care reporting some improvements in her back pain overall as she is occasionally experiencing low levels of back pain, which was not the case prior to therapy. She continues to report 9-10/10 pain with prolonged standing and household activities, which has not changed since her start of care. She has normal lumbar AROM, though reports increased LBP at the end range of all planes. Her BLE strength has remained relatively unchanged since the initial evaluation, despite heavy emphasis on core/hip strengthening throughout duration of care. She demonstrates good body mechanics with lifting moderate loads without reports of increased pain. She has met her maximal rehab potential at this time and was recommended to f/u with her referring provider for further assessment/treatment of her ongoing back pain.    PT Treatment/Interventions ADLs/Self Care Home Management;Aquatic Therapy;Iontophoresis 4mg /ml Dexamethasone;Traction;Gait training;Therapeutic activities;Therapeutic exercise;Neuromuscular re-education;Manual techniques;Dry needling;Spinal Manipulations;Joint Manipulations    PT Next Visit Plan --    Baker and Agree with Plan of Care Patient             Patient will benefit from skilled therapeutic intervention in order to improve the following deficits and impairments:  Pain  Visit Diagnosis: Midline low back pain, unspecified chronicity, unspecified whether sciatica present  Muscle weakness     Problem List Patient Active Problem List   Diagnosis Date Noted   Dysuria 02/16/2021   Left breast mass 01/25/2021    Constipation 01/25/2021   Chronic midline low back pain 01/25/2021   Palpitations 01/25/2021   Encounter for health examination of refugee 01/24/2021  PHYSICAL THERAPY  DISCHARGE SUMMARY  Visits from Start of Care: 11  Current functional level related to goals / functional outcomes: See goals above   Remaining deficits: See impression above   Education / Equipment: See education above   Patient agrees to discharge. Patient goals were partially met. Patient is being discharged due to maximized rehab potential.   Gwendolyn Grant, PT, DPT, ATC 05/25/21 6:42 PM  [facility] [address] [place], [place], [address] Phone: [phone]   Fax:  [phone]  Name: Elsy Chiang MRN: [id number] Date of Birth: [birth year]

## 2021-05-26 ENCOUNTER — Encounter: Payer: Self-pay | Admitting: Student

## 2021-05-26 ENCOUNTER — Other Ambulatory Visit: Payer: Self-pay

## 2021-05-26 ENCOUNTER — Ambulatory Visit (INDEPENDENT_AMBULATORY_CARE_PROVIDER_SITE_OTHER): Payer: Medicaid Other | Admitting: Student

## 2021-05-26 VITALS — BP 102/62 | HR 72 | Ht 69.69 in | Wt 143.6 lb

## 2021-05-26 DIAGNOSIS — R0602 Shortness of breath: Secondary | ICD-10-CM | POA: Diagnosis not present

## 2021-05-26 DIAGNOSIS — F419 Anxiety disorder, unspecified: Secondary | ICD-10-CM | POA: Diagnosis not present

## 2021-05-26 DIAGNOSIS — R12 Heartburn: Secondary | ICD-10-CM | POA: Insufficient documentation

## 2021-05-26 MED ORDER — OMEPRAZOLE 20 MG PO CPDR: 20.0000 mg | DELAYED_RELEASE_CAPSULE | Freq: Every day | ORAL | 3 refills | Status: DC

## 2021-05-26 MED ORDER — HYDRALAZINE HCL 10 MG PO TABS: 10.0000 mg | ORAL_TABLET | Freq: Three times a day (TID) | ORAL | 1 refills | Status: DC

## 2021-05-26 NOTE — Progress Notes (Addendum)
    SUBJECTIVE:   CHIEF COMPLAINT / HPI:   [name] is a 40 year old female with past medical history significant for lower back pain and palpitation. She presents today with  history of SOB that has gotten worse in the last 6 months following COVID infection.  Symptoms wax and wane both at rest or with activity. Patient said it feels like her chest is heavy and she struggles to push out air. She reports feeling anxious during this episodes but attributes it to the fear that she is unable to breath well. She endorses occasional heart burn and denies any hx of lungs disease, fever, chills, drastic weight loss or cough.   Patient preferred language is Arabic. Arabic interpreter was used at this visit.   PERTINENT  PMH / PSH:  Dysuria and chronic midline lower back pain.  Review of Systems  Constitutional:  Negative for chills, fever and weight loss.  Respiratory:  Positive for shortness of breath. Negative for cough, hemoptysis and sputum production.   Cardiovascular:  Negative for chest pain, palpitations and leg swelling.  Gastrointestinal:  Positive for heartburn. Negative for abdominal pain, constipation, diarrhea, nausea and vomiting.  Genitourinary:  Negative for dysuria, hematuria and urgency.    OBJECTIVE:   BP 102/62   Pulse 72   Ht 5' 9.69" (1.77 m)   Wt 143 lb 9.6 oz (65.1 kg)   SpO2 100%   BMI 20.79 kg/m    Physical Exam General: Alert, well appearing, NAD, Oriented x4 Cardiovascular: RRR, No Murmurs, Normal S2/S2 Respiratory: CTAB, No wheezing or Rales Abdomen: No distension or tenderness Extremities: No edema on extremities   Skin: Warm and dry  ASSESSMENT/PLAN:   SOB (shortness of breath) Anxiety She recently moved from [place] as a refugee with her husband and 4 kids which can be stressful. Recent EKG and Chest xray were normal. PE was unremarkable. Patient reports history of SOB at rest associated with anxious feeling and normal labs is suspicious for  anxiety. -Ordered hydralazine 10 mg 3 times daily. -Follow-up in 2 months  Heart burn Pt with complaint of heart burn and abdominal pain following the use of miralax. -Ordered omeprazole 20 mg once daily -Follow-up in 2 months.      Jerre Simon, MD [facility]

## 2021-05-26 NOTE — Assessment & Plan Note (Signed)
Anxiety She recently moved from [place] as a refugee with her husband and 4 kids which can be stressful. Recent EKG and Chest xray were normal. PE was unremarkable. Patient reports history of SOB at rest associated with anxious feeling and normal labs is suspicious for anxiety. -Ordered hydralazine 10 mg 3 times daily. -Follow-up in 2 months

## 2021-05-26 NOTE — Patient Instructions (Addendum)
?????? ???? ? ??? ?? ?????? ????? ?????. ????? ?? ??? ?????? ?? ??? ???? ????? ?? ??????. ???? ??? ???? ???? ??? ??????? ?? ?????? ?????:  ??????   SOB ????? ?? ????? ????? ???? ???? ??? ????? ?????? ??? ???? ??? ???? ?????? ?????.  ?? ???? ??? ??????? ???? ???? ????? ?? ???????. ??? ???? ????? ??????????? ???? ???? ??????????? ?????  ?????? ?? ???  ???? ????? ???? ??????? ?????? ?? ?? ?? ????!  ??? ???? ???? ??? ????? ?? ????????? ? ??? ????? ?? ??????? ??? ??? ?????? ?? ????? MyChart.  Jerre Simon, MD [facility]

## 2021-05-26 NOTE — Assessment & Plan Note (Signed)
Pt with complaint of heart burn and abdominal pain following the use of miralax. -Ordered omeprazole 20 mg once daily -Follow-up in 2 months.

## 2021-06-02 ENCOUNTER — Other Ambulatory Visit: Payer: Self-pay

## 2021-06-02 ENCOUNTER — Ambulatory Visit: Payer: Medicaid Other | Admitting: Pharmacist

## 2021-06-02 DIAGNOSIS — R0602 Shortness of breath: Secondary | ICD-10-CM

## 2021-06-02 DIAGNOSIS — K5900 Constipation, unspecified: Secondary | ICD-10-CM | POA: Diagnosis not present

## 2021-06-02 MED ORDER — DOCUSATE SODIUM 50 MG PO CAPS: 50.0000 mg | ORAL_CAPSULE | Freq: Every day | ORAL | 1 refills | Status: DC | PRN

## 2021-06-02 MED ORDER — SERTRALINE HCL 25 MG PO TABS: 25.0000 mg | ORAL_TABLET | Freq: Every day | ORAL | 1 refills | Status: DC

## 2021-06-02 NOTE — Assessment & Plan Note (Signed)
Patient has been experiencing shortness of breath and chest tightness that is most likely related to anxiety symptoms and less likely due to lung disease given spirometry evaluation with pre- and post-bronchodilator reveals normal lung function. Anxiety symptoms have not improved since last visit.  -Reviewed results of pulmonary function tests with patient.  -begin sertraline 25 mg daily and discontinue hydralazine.  -Educated patient to call the clinic if she has any worsening symptoms of anxiety. Discussed potential side effects of sertraline such as insomnia and stomach upset. Informed patient that onset of this medication can take a few weeks to feel an effect.

## 2021-06-02 NOTE — Progress Notes (Signed)
   S:    Patient arrives in good spirits and ambulating well.    Presents for lung function evaluation.   Patient was referred on 05/26/21 after seeing Dr. Elliot Gurney.  Patient was last seen by Primary Care Provider, Dr Mauri Reading, on 02/25/21, has not yet established with Dr. Leary Roca, her new PCP.   Patient reports breathing has been consistent. Patient denies atopic sx consistent with allergic rhinitis. Patient main complaint is chest tightness. Her breathing is 5/10. She reports feeling anxious sometimes. Today specifically due to the distance of her home from her children's bus stop.   Patient denies feeling dizzy or light headed. Endorses her heartburn symptoms have resolved since starting omeprazole.   She also reports having a dermatologic pimple that is bothersome on her head and would like to be evaluated.   Current asthma medications: None  O: Physical Exam Constitutional:      Appearance: Normal appearance. She is normal weight.  Pulmonary:     Effort: Pulmonary effort is normal.  Neurological:     Mental Status: She is alert.  Psychiatric:        Behavior: Behavior normal.        Thought Content: Thought content normal.    Review of Systems  Respiratory:  Positive for shortness of breath.   Psychiatric/Behavioral:  The patient is nervous/anxious.    See "scanned report" or Documentation Flowsheet (discrete results - PFTs) for  Spirometry results. Patient provided good effort while attempting spirometry.   Lung Age = 84   A/P: Patient has been experiencing shortness of breath and chest tightness that is most likely related to anxiety symptoms and less likely due to lung disease given spirometry evaluation with pre- and post-bronchodilator reveals normal lung function. Anxiety symptoms have not improved since last visit.  -Reviewed results of pulmonary function tests with patient.  -begin sertraline 25 mg daily  and discontinue hydralazine.  -Educated patient to call the clinic  if she has any worsening symptoms of anxiety. Discussed potential side effects of sertraline such as insomnia and stomach upset. Informed patient that onset of this medication can take a few weeks to feel an effect.   Miralax ineffective for relieving constipation and patient also reports Miralax causing heart burn side effects -Start docusate 50 mg daily PRN constipation -Discontinue Miralax  Dermatologic pimple was assessed and addressed by Dr. Jennette Kettle. Recommended following up with Dr. Lum Babe at dermatologic clinic.   Patient verbalized understanding of results and education.  Written pt instructions provided.   F/U with PCP, Dr. Leary Roca, in 3-4 weeks. Total time in face to face counseling 45 minutes.  Patient seen with Shellia Carwin, PharmD Candidate, Valeda Malm, PharmD - PGY-1, and Pervis Hocking, PharmD - PGY2 Pharmacy Resident.  Marland Kitchen

## 2021-06-02 NOTE — Patient Instructions (Addendum)
It was nice to see you today!  Medication Changes: Begin sertraline 25 mg daily and docusate 50 mg as needed for constipation.   Continue omeprazole 20 mg daily   Discontinue hydralazine   Please call the clinic if you have any worsening anxiety symptoms or worsening depression.   Please schedule her with dermatologic clinic, with Dr. Lum Babe as she needs a female provider.    Follow up with Dr. Leary Roca in 3-4 weeks.  ---------------------------------------------------------------------------------------------------------------------------------------------------------------------------------  ???? ?????? ?????!  ??????? ??????: ???? ????????? 25 ??? ?????? ???? 50 ??? ??? ?????? ???????.  ????? ????? ?????????? 20 ??? ??????  ???? ?? ????? ????????????   ???? ??????? ???????? ??? ??? ???? ?? ????? ?? ????? ????? ?? ????? ????????.  ???? ????? ???? ??? ?? ????? ??????? ??????? ? ?? ???????? ?????? ????? ????? ??? ?????.     ???????? ?? ??????? ?????? ?? 3-4 ??????.

## 2021-06-03 ENCOUNTER — Telehealth: Payer: Self-pay | Admitting: Student

## 2021-06-03 NOTE — Telephone Encounter (Signed)
Called patient to advice her to stop Hydralazine. Patient informed me she stopped up the medication  yesterday as instructed by a doctor  at her visit yesterday (06/02/21).

## 2021-06-06 ENCOUNTER — Other Ambulatory Visit: Payer: Self-pay | Admitting: Student

## 2021-06-06 NOTE — Progress Notes (Signed)
Stopped medication entered in error

## 2021-06-06 NOTE — Progress Notes (Signed)
Reviewed: I agree with Dr. Koval's documentation and management. 

## 2021-07-14 ENCOUNTER — Other Ambulatory Visit: Payer: Self-pay

## 2021-07-14 ENCOUNTER — Ambulatory Visit: Payer: Medicaid Other | Admitting: Student

## 2021-07-14 VITALS — BP 105/60 | HR 62 | Wt 141.2 lb

## 2021-07-14 DIAGNOSIS — L989 Disorder of the skin and subcutaneous tissue, unspecified: Secondary | ICD-10-CM | POA: Insufficient documentation

## 2021-07-14 NOTE — Progress Notes (Signed)
    SUBJECTIVE:   CHIEF COMPLAINT / HPI: Scalp lesion   Skin lesion of scalp Patient had scalp lesion present for 2 years, She says it started out as a small overgrowth which has progressed to this size over the two years. It intermittently causes her itching and pain. She denies any rashes or similar lesions elsewhere on her body. She says her husband has eczema but no one around her has similar lesions. Denies any fevers or systemic symptoms.  PERTINENT  PMH / PSH: chronic midline low back pain, L breast mass, heart burn, palpitations  OBJECTIVE:   BP 105/60   Pulse 62   Wt 141 lb 3.2 oz (64 kg)   SpO2 99%   BMI 20.44 kg/m   Skin: uniformly hypopigmented scalp lesion. Rubbery and no tenderness to palpation, no fluctuance, no surrounding skin breakdown. Scalp dry, scaly.       [facility]  Skin Biopsy Procedure Note PRE-OP DIAGNOSIS: hypopigmented overgrowth of scalp POST-OP DIAGNOSIS: Same  PROCEDURE: skin biopsy Performing Physician: Drs. Payge Eppes Laroy Apple and Fayette Pho Supervising Physician (if applicable): Dr. Janit Pagan  PROCEDURE:  _  Shave Biopsy          The area surrounding the skin lesion was prepared and in the  usual sterile manner. The lesion was removed in the usual manner by the  biopsy method noted above. Hemostasis was assured with silver nitrite. The patient tolerated  the procedure well.  Closure:   None  Lesion was removed and sent to pathology per patient's request In person arabic translator was used throughout visit and procedure All questions answered  ASSESSMENT/PLAN:   Skin lesion of scalp Removed by shave biopsy today 07/14/2021 and sent to pathology per patients request. -wound care instructions provided in Arabic -advised gentle shampoo until wound healed ~2 weeks -advised pressure on wound if bleeding reoccurs -advised ketoconazole shampoo/selsun blue only after wound healed as patient asked about dry scaly  scalp     Levin Erp, MD

## 2021-07-14 NOTE — Patient Instructions (Signed)
??????? ??????? ??? ???????? Wound Care, Adult ?? ??????? ??????? ????? ????? ??? ??????? ?? ????? ??????? ???? ???? ????? ???? ?? ????? ??? ???????. ???? ????? ??? ??????? ?? ??????? ????????. ??????????? ???? ??????? ?????? ??? ??????? ??????? ??????? ??????. ?????????? ????????: ????? ????????. ?????? ????? ?????. ???. ???? ???? ????? (?????) ?? ??? ??? ?? ???? ????? ?????? ?????? ????? ?? ?? ???? ?????? ??? ??? ?????. ???? ??????? ??????? ??????? ?????? ??????? ?? ???? ???????? ????? ???????? ???? ??? ?? ????????. ???? ?? ???? ????? ??? ?????? ??? ????? ???? ??????? ?????? ?????????. ????? ??????? ????? ????? ????? ???? ???? ??????? ?????? ?? ??????? ??????? ?????? ?????. ????? ???? ???: ??????? ????? ???? ??? ????? ???? ?? ???? ??????. ??????? ???? ???? ????? ??????? ??? ????? ??? ??? ??? ??????. ?? ???? ????? ?? ???. ????? ??????? ???? ???? ?????? ???????? ???? 20 ????? ??? ?????? ??? ????? ??????? ???? ???????. ???? ?? ????? ????? ????????? ???? ??????? ???? ??????. ???? ??????? ??? ??????? ???? ??????? ??????. ????? ???? ???: ????? ?????? ?? ???? (??????). ??? ????? ??? ?????? ?? ???? ????? (?????). ????? ?????? ?????? ??????. ??? ??? ????? (??? ???????) ?? ??? ????? ?? ??????? ??????? ?? ??????. ?????? ?????? ?????? ????? ??? ?????? ??? ????? ???? ??????? ?? ????. ???? ???? ???? ??????? ??????? ????? ??????? ????? ?? ?????? ?????????. ??? ???? ??????? ??????? ???? ?? ????? ?? ?? ????? ???? ??????? ?????? ????. ???? ???? ??????? ?????? ??? ????? ??? ????? ???????. ??? ?????? ????????? ??? ?????? ?? ?????? ???? ???? ???? ?? ??? ?????? ?? ???? ?????? ??? ??? ??????. ????? ????? ?????? ?? ????: ?????? ?? ???????? ?? ?????? ?? ?????. ???? ?? ??. ???????. ???? ??? ?? ????? ?????.  ????? ?????????? ??????? ?? ?????? ??????? ??? ??? ?? ??????? ??????? ?????? ?????? ?????? ??? ???? ???? ?? ???? ?? ???? ??????? ?? ??????? ??? ????????. ??? ????? ?? ??????? ?????? ?????? ??? ?????  ?????. ??? ??? ???? ??????? ?????? ?? ??????? ??????? ??? ??????? ?????? ???? 30 ?????? ?????? ?? ??? ??????? ???? ???????. ????? ??????? ???? ????? ????? ???? ?? ??? ???? ???? ??? ?? ????? ?? ???? ??????? ??????. ????? ?????? ????? ??????? ???? ????? ??? ?????????? ???????? ? ???????? ? ?????? ?? ??????? ????? ?????? ???????? ???????? ??? ?????? ?????. ?????? ???????? ?????? ???????? ?????????? ????????? ?? ???????? ?????? ?????????. ????? ????????? ??????? ??????? ?? ??????? ?????? ???????? ?. ??????? ???????? ????????? ??????? ?? ???????? ?????? ???????? ?. ???? ????? ????? ?? ??????? ???? ??? ???? ?????? ??????. ??????? ???? ?? ????? ?? ???? ?? ?????? ????? ?????? ?? ???? ?? ??? ???? ????? ??????? ?????? ??? ????? ???? ??????? ?????? ??? ???. ????? ???? ??????? ?????? ?? ??? ????? ?????????. ?? ??? ???????? ????? ??? ?????????? ??????? ???. ?? ??? ?? ???? ?????. ?????? ?? ?????? ??? ??????? ????? ??????? ??????. ???? ?????? ?????? ??????? ??? ??????? ??????? ??????? ??????. ?????? ???? ??????? ?????? ???? ??????? ?????? ??. ??? ????? ?? ???? ????? ????? ???? ???? ?????? ?? ??? 6 ???? ?? ?????? ???? ???? ??? ???? ??????? ??????. ??? ????? ?????? ?? ?? ???????? ??????? ?? ????? ?? ????? ????? ?? ????? ???? (SPF) 30 ??? ?????. ?? ?????? ?????? ????? ??? ????????? ?? ????? ??? ??????? ???????? ??????????? ???? ?????. ???? ???????? ?? ???? ?????? ??????. ?????? ???? ??????? ?????? ??? ????? ??? ???????? ??????? ?? ???????. ????? ????? ?????? ???????? ??? ??????? ???? ??????? ??????. ???? ??? ???. ???? ????? ??????? ?????? ?? ??????? ???????: ??? ????? ???? ?????? (?????????) ?? ????? ?? ???? ?? ??? ??? ?? ?????? ?? ???? ?? ???? ?????. ??? ?????? ??? ??????? ??? ????? ????????. ???? ???? ???????? ??????? ?????? ??? ???? ????: ????? ???????? ?? ?????? ?? ????? ??? ??????. ???? ???? ?? ?? ?? ??????. ?????? ???? ????? ?? ??????. ???? ???? ?? ????? ????? ?? ??????. ??? ?? ???????. ????? ?????? ??  ??????. ?????? ???????. ???? ???????? ????? ?? ??????? ???????: ???? ?? ???? ??? ????? ??? ??????? ??????? ??????. ?? ????? ???? ???????? ?? ????? ?? ??? ????? ?? ??????? ??????? ??? ??? ???? ??? ????? ?????? ????? ?? ?????. ??? ????? ???? ???? ????? ??? ????? ????. ???? ??? ????. ??????? ???????. ??????? ?????? ?? ??????. ???? ???? ??? ??????? ????? ????? ????? ???? ???? ?????. ?? ????? ?? ??? ???? ??????? ????? ?? ??. ??? ???? ??? ???????? ?????? ??? ?????. ???? ?????? ??????? ??????? (  911 ?? ???????? ??????? ?????????). ?? ??? ??????? ????? ??? ????????. ???? ???? ???? ?????? ?????? ???????? ???? 20 ????? ??? ????? ??? ?? ??? ???? ???? ??????? ???? ???????. ???? ??????? ??? ??????? ???? ??????? ??????. ???????? ??? ?????? ?????? ????? ??????? ?????? ??????????? ???????? ? ???????? ? ?????? ?? ??????? ????????. ???? ??? ????? ?? ??? ?????? ?? ???? ?????? ??????. ???? ????? ??????? ?????? ??????? ????? ??? ???? ?????? ????? ?????. ??? ????? ?? ??? ????????? ?? ???? ?????? ????????? ???? ?????? ???? ?????????????. ???? ?? ?????? ??? ????? ???? ?? ???? ?? ???? ??????? ??????.? Document Revised: 10/07/2019 Document Reviewed: 10/07/2019 Else[employer] Patient Education  2022 [employer]  

## 2021-07-14 NOTE — Progress Notes (Deleted)
    SUBJECTIVE:   CHIEF COMPLAINT / HPI:   *** Derm complaints - no previous PCP or [facility] encounters recall reason for derm clinic referral - no photos under media tab of any skin-related issue  PERTINENT  PMH / PSH: chronic midline low back pain, L breast mass, heart burn, palpitations  OBJECTIVE:   There were no vitals taken for this visit.  ***  ASSESSMENT/PLAN:   No problem-specific Assessment & Plan notes found for this encounter.     Fayette Pho, MD [facility]

## 2021-07-14 NOTE — Assessment & Plan Note (Addendum)
Removed by shave biopsy today 07/14/2021 and sent to pathology per patients request. -wound care instructions provided in Arabic -advised gentle shampoo until wound healed ~2 weeks -advised pressure on wound if bleeding reoccurs -advised ketoconazole shampoo/selsun blue only after wound healed as patient asked about dry scaly scalp

## 2021-07-20 ENCOUNTER — Telehealth: Payer: Self-pay | Admitting: Student

## 2021-07-20 ENCOUNTER — Telehealth: Payer: Self-pay

## 2021-07-20 ENCOUNTER — Encounter: Payer: Self-pay | Admitting: Student

## 2021-07-20 NOTE — Telephone Encounter (Signed)
Will you call this pt and schedule the appt for Dr. Elliot Gurney?  See his message for details. Sunday Spillers, CMA

## 2021-07-20 NOTE — Telephone Encounter (Signed)
-----   Message from Jerre Simon, MD sent at 06/06/2021  4:37 PM EDT ----- Regarding: Medication review Hi,  Please can you schedule an appointment for this patient with me. I know I don't have any availability soon. I will like to see her soon and we can double book one of my appointments coming up. This appointment will be strictly for medication review. Thanks  Dr. Elliot Gurney

## 2021-07-20 NOTE — Telephone Encounter (Signed)
Spoke with pt over the phone using arabic interpreter about her most recent biopsy on 07/14/21 with Dr. Laroy Apple. Informed pt that it was a benign cyst that does not require any further management. If she is having any signs of infection such as fever or drainage, to please let us know. Answered question from pt regarding recurrence of the cyst. Discussed that recurrence is a possibility. If she desires further removal should this occur, we could refer her to dermatology or surgery for complete cyst removal but it is not medically necessary as the cyst is benign. Pt voiced content with this answer and did not have any further questions.

## 2021-07-22 ENCOUNTER — Telehealth: Payer: Self-pay

## 2021-08-03 ENCOUNTER — Encounter: Payer: Self-pay | Admitting: Student

## 2021-08-03 ENCOUNTER — Ambulatory Visit: Payer: Medicaid Other | Admitting: Student

## 2021-08-03 ENCOUNTER — Other Ambulatory Visit: Payer: Self-pay

## 2021-08-03 VITALS — BP 112/59 | HR 80 | Wt 144.2 lb

## 2021-08-03 DIAGNOSIS — K5900 Constipation, unspecified: Secondary | ICD-10-CM | POA: Diagnosis present

## 2021-08-03 MED ORDER — DOCUSATE SODIUM 50 MG/5ML PO LIQD: 50.0000 mg | Freq: Every day | ORAL | 0 refills | Status: AC

## 2021-08-03 NOTE — Patient Instructions (Addendum)
??? ?? ?????? ????? ?????. ????? ?? ??? ?????? ?? ??? ???? ????? ?? ??????. ???? ??? ???? ???? ??? ??????? ?? ?????? ?????:  ?? ???? ?? ?? ???? ?????? ?? ???????????? ?????? ?? ??? ???? ?? ???? ???? ????? ?? ?????????.  ??? ???? ????? ??   Docusate ???????? ?? ???????.  ?? ????????? ???? ??????? ?? ???? ??????? ???????? ????? ?? ?????? ?? ??? ???? ???? ??????? ???? ????? ?????? ?? ????? ?? ??????.    ??? ???? ???? ?? ????? ?? ????????? ? ??? ????? ?? ??????? ??? ??? ?????? ?? ????? MyChart.  ??? ?????? ? ??????? ?? ???? ????? ???? ??? ??? ??????           It was wonderful to see you today. Thank you for allowing me to be a part of your care. Below is a short summary of what we discussed at your visit today:  You reported no side effect with hydralazine and report your chest pressure has significantly improved with sertraline.  I have put in an order for Docusate to help with your constipation.   Follow-up in 2 weeks with your PCP to see if your bowel movement and stomach pain improved with the medication.    If you have any questions or concerns, please do not hesitate to contact us via phone or MyChart message.   Jerre Simon, MD [facility]

## 2021-08-03 NOTE — Progress Notes (Signed)
    SUBJECTIVE:   CHIEF COMPLAINT / HPI: Medication review   Patient report that she didn't take the Hydralazine long enough to have any side effects. She discontinued the medication as soon as I instructed her over the phone not to take it anymore. Denies having any headache, dizziness or irregular heart beat. Patient endorses her chest pain/ pressure has significantly improved since she started sertraline.   Patient report she still have constipation. She hasn't started the her docusate medication because the pharmacist informed her there was no order for docusate. She report she goes stretch of days without bowel movement, longest stretch is 10 days. She has occasional belly pain after meals. Denies having any history of emesis or blood in stool.  PERTINENT  PMH / PSH: Constipation,  SOB, palpitation  OBJECTIVE:   BP (!) 112/59   Pulse 80   Wt 144 lb 3.2 oz (65.4 kg)   SpO2 100%   BMI 20.88 kg/m     Physical Exam General: Alert, well appearing, NAD, Oriented x3 Cardiovascular: RRR, No Murmurs, Normal S2/S2 Respiratory: CTAB, No wheezing or Rales Abdomen: Soft, Normal bowel sound, No distension or tenderness Extremities: No edema on extremities   Skin: Warm and dry  ASSESSMENT/PLAN:   Constipation Patient was not able to start her Docusate that was prescribed. She reported continued constipation. On exam abdomen is soft with normal bowel sound, and no tenderness. Patient has history of chronic constipation and reports heart burn with miralax.  - Placed an order for docusate 50 mg to be taking daily -Encouraged patient to increase water intake and high fiber diet -Provided her with additional resource with list of diet to improve constipation. -Patient to follow up with her PCP in two weeks for update on constipation    Follow up with PCP in 2 weeks to disccuss birth control and update on constipation    Jerre Simon, MD [facility]

## 2021-08-03 NOTE — Assessment & Plan Note (Signed)
Patient was not able to start her Docusate that was prescribed. She reported continued constipation. On exam abdomen is soft with normal bowel sound, and no tenderness. Patient has history of chronic constipation and reports heart burn with miralax.  - Placed an order for docusate 50 mg to be taking daily -Encouraged patient to increase water intake and high fiber diet -Provided her with additional resource with list of diet to improve constipation. -Patient to follow up with her PCP in two weeks for update on constipation

## 2021-08-06 IMAGING — CR DG LUMBAR SPINE COMPLETE 4+V
3 series · 3 of 3 positions shown · non-contrast
Comparison: None.

CLINICAL DATA: Thoracic spine pain to palpation

EXAM:
LUMBAR SPINE - COMPLETE 4+ VIEW; THORACIC SPINE 2 VIEWS

[w lumbar spine ap]
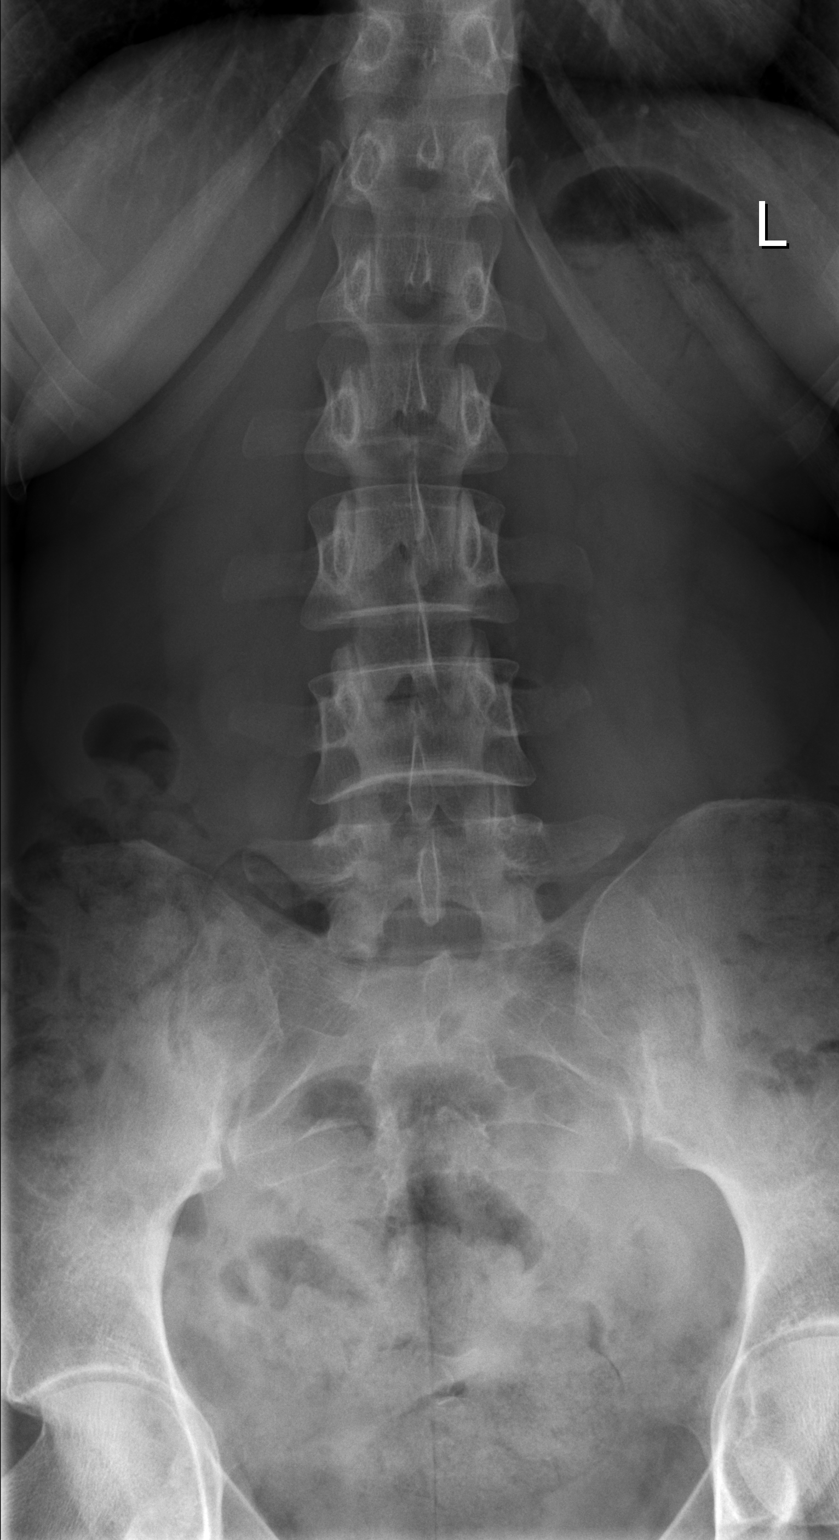

[w lumbar spine obl (1 of 2)]
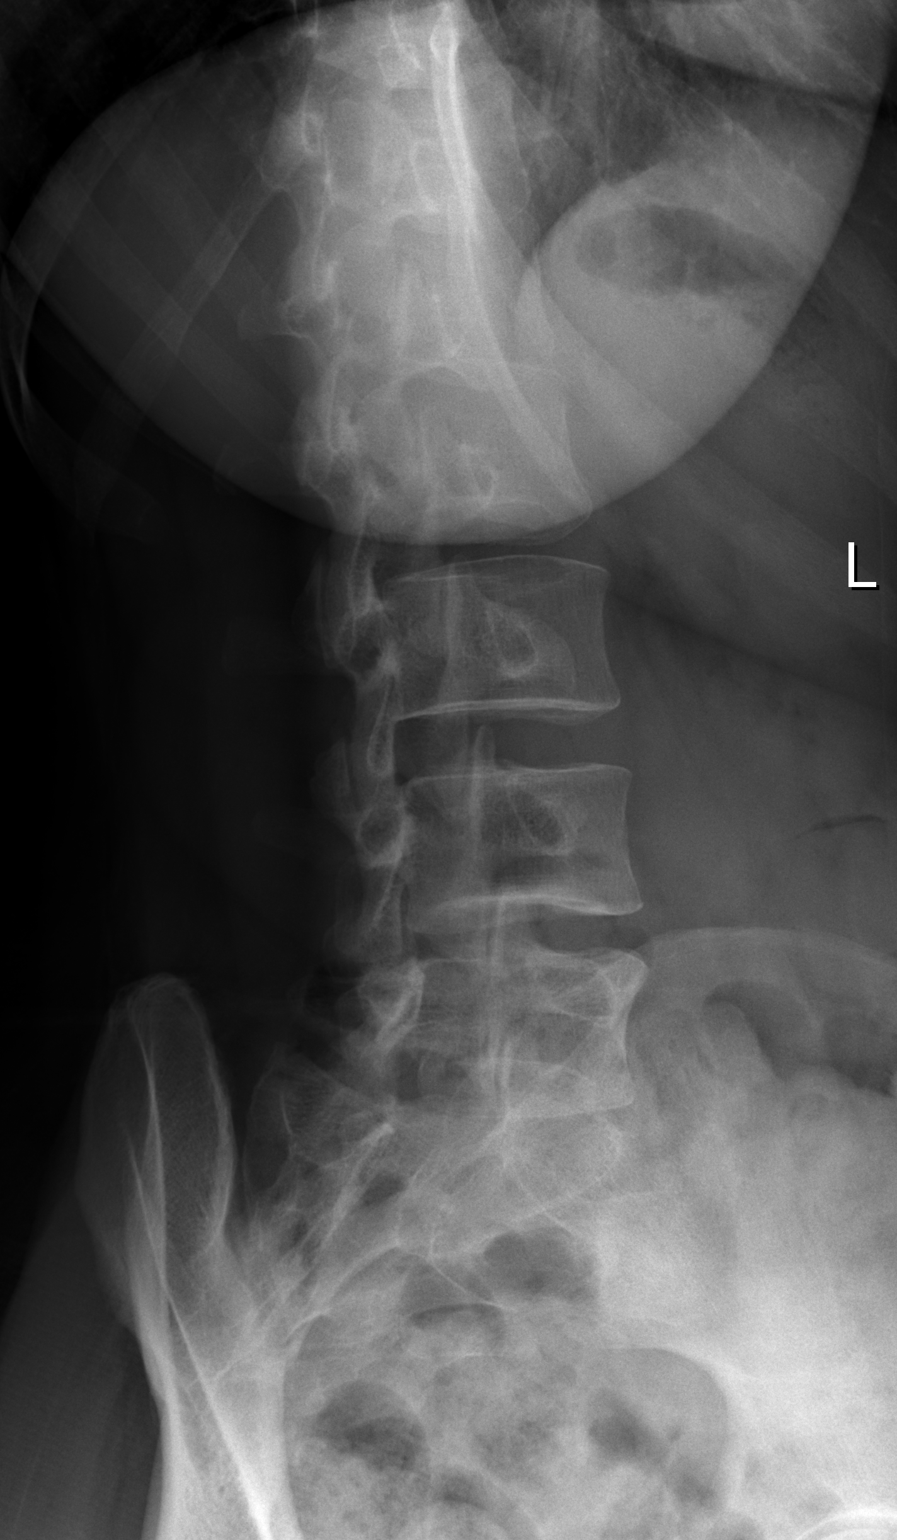

[w lumbar spine obl (2 of 2)]
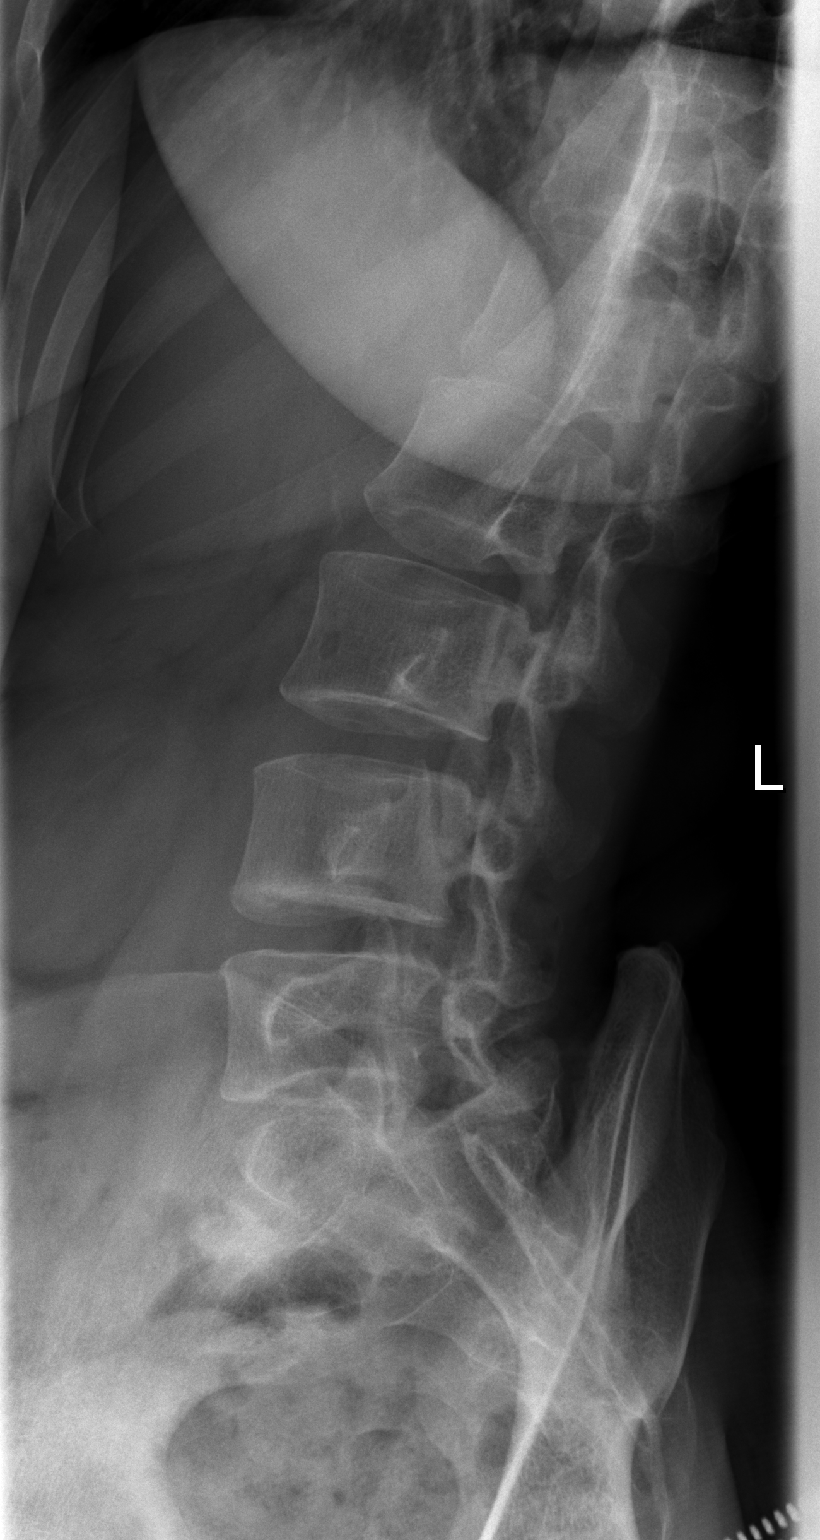

[3 of 3 positions shown; findings below may reference images not displayed]

FINDINGS: Lumbar: There is no evidence of lumbar spine fracture. Slight
dextrocurvature. Intervertebral disc spaces are maintained.

Thoracic: Slight levocurvature. No evidence of fracture,
degenerative disease, or bone lesion.
IMPRESSION: Slight scoliotic curvature of the thoracic and lumbar spine. No
focal or degenerative changes noted.

## 2021-08-26 ENCOUNTER — Ambulatory Visit: Payer: Medicaid Other | Admitting: Family Medicine

## 2021-08-30 ENCOUNTER — Ambulatory Visit: Payer: Medicaid Other | Admitting: Family Medicine

## 2021-08-30 ENCOUNTER — Other Ambulatory Visit: Payer: Self-pay

## 2021-08-30 ENCOUNTER — Encounter: Payer: Self-pay | Admitting: Family Medicine

## 2021-08-30 VITALS — BP 113/60 | HR 70 | Ht 69.0 in | Wt 144.2 lb

## 2021-08-30 DIAGNOSIS — R0602 Shortness of breath: Secondary | ICD-10-CM | POA: Diagnosis not present

## 2021-08-30 DIAGNOSIS — G8929 Other chronic pain: Secondary | ICD-10-CM

## 2021-08-30 DIAGNOSIS — M549 Dorsalgia, unspecified: Secondary | ICD-10-CM | POA: Diagnosis not present

## 2021-08-30 DIAGNOSIS — N6320 Unspecified lump in the left breast, unspecified quadrant: Secondary | ICD-10-CM | POA: Diagnosis not present

## 2021-08-30 DIAGNOSIS — M419 Scoliosis, unspecified: Secondary | ICD-10-CM | POA: Insufficient documentation

## 2021-08-30 DIAGNOSIS — F419 Anxiety disorder, unspecified: Secondary | ICD-10-CM

## 2021-08-30 DIAGNOSIS — Z309 Encounter for contraceptive management, unspecified: Secondary | ICD-10-CM

## 2021-08-30 HISTORY — DX: Anxiety disorder, unspecified: F41.9

## 2021-08-30 HISTORY — DX: Scoliosis, unspecified: M41.9

## 2021-08-30 MED ORDER — SERTRALINE HCL 25 MG PO TABS: 25.0000 mg | ORAL_TABLET | Freq: Every day | ORAL | 0 refills | Status: DC

## 2021-08-30 MED ORDER — IBUPROFEN 400 MG PO TABS: 400.0000 mg | ORAL_TABLET | Freq: Two times a day (BID) | ORAL | 0 refills | Status: DC | PRN

## 2021-08-30 NOTE — Assessment & Plan Note (Signed)
[facility] clinic appointment made for her during this visit for Nexplanon removal. She is aware of the date and time.

## 2021-08-30 NOTE — Assessment & Plan Note (Signed)
Chronic pain. I reviewed her breast mammogram and U/S which were both normal. I advised that sometimes breast engorgement in lactating mothers can cause pain. May use warm compression and Ibuprofen as needed for pain. Return soon especially if pain persists after weaning her child off breast milk or if symptoms worsens. She agreed with the plan.

## 2021-08-30 NOTE — Patient Instructions (Signed)
????? ??????? ????? ?????   How to Use a Back Brace  ????? ????? ????? ???? ??? ????? ???? ???? ????? ?????? ????????. ?????? ????? ???? ??:  ???? ?? ??? ????? ?? ????? ????? ???? ?????? ?????? ??? ?????? ?????? ?????? ??? ??????? (?????). ??? ?????? ?????? ??? ??? ?????? ?????? ?? ??????.  ??? ?? ????? ????? ?? ?????? ??????.  ???? ????? ?? ????? ??????? (???????) ???????? ??? ???? ?????? ?????? (???????).  ????? ??? ????? ????? ??????? ?????? ?? ????? ??????? ?? ????? ?? ?????. ??????? ????? ???? ??? ?? ????????? (????? ????) ?? ???? ????? (????? ?????????).  ??????? ?????? ???? ????? ????? ?????? ?? ????? ??????? ?? ?????? ??????.  ??????? ??????????? ??????? ?? ???? ???? ????? ?????? ??? ??? ???? ??? ????? ???? ????? ?????. ????? ???? ??????? ?????? ???????? ???????? ????? ?????????? ?????? ??????. ?? ????????  ??????? ??????? ???? ?? ?????. ???? ??? ?????? ??????? ??? ??????? ???? ??????? ?????? ??????? ?? ?????? ????? ???.  ????? ?? ?????. ??? ?????? ????? ????? ?? ???? ??? ????.  ????? ?????? ???? ??????? ??????.  ??? ?????. ?? ???? ?????? ??????? ??????? ???? ???????? ??????????? ??? ???? ?????? ????? ????? ??? ?? ???? ??? ??????? ????. ????? ??????? ????? ?????  ????? ??????? ????????? ?? ??? ????. ???? ??????? ????? ??????? ?????? ??????? ?? ?????:  ????? ?????? ???????.  ????? ?????? ??????? ???? ????????. ?? ??? ???????? ?? ???? ?????? ???????? ????? ?????. ???? ?????? ??????? ????? ?????? ?????? ?? ???? ????? ???????? ???? 16-23 ???? ??????.  ????? ??? ???????.  ?? ????? ??????? ???? ??????? ??? ?????? ???????. ??? ?? ???? ?? ???: ? ?????? ???? ?? ????? ?????? ???????. ???????? ????? ?????? ??????? ??? ???????? ?? ???? ??? ?????? ?? ????????. ? ??????? ???? ?? ????? ???????? ????? ??? ??? ???? ???? ???????. ? ?????? ??? ????? ????? ?????. ?????? ??? ?? ????? ????? ?? ?????? ??? ????? ??? ??????? ???????? ??????. ? ??? ??? ????? ?????? ???????? ??????. ????? ???? ??????? ??????  ??? ??????. ????? ??????? ?????? ?????  ?? ???? ????? ????? ?????. ?????? ????? ??????? ????????? ?? ??????? ??? ???? ??? ???.  ??? ??? ????? ????? ????? ????? ??? ????? ?? ???? ??? ?? ??????? ???? ?? ??????? ????. ??????? ??? ??? ?????? ????? ?? ?????.  ??? ??????? ?? ?????? ???????? ????? ?????? ???? ??? ??????? ???? ??????? ?????? ??????? ??. ???? ????? ??????? ?????? ?? ??????? ???????:  ??? ???????.  ?????? ???? ?? ?????? ??? ?????? ???????.  ????? ??? ????? ?? ??? ????? ????? ?????.  ?????? ?????? ????? ?? ???? ??? ???? ?????? ???????. ????  ?? ????? ??? ?????? ????? ????? ??????? ?? ??? ????? ?? ????? ???? ???? ?????? ??????? ??? ?????? ?????? ?????? ??? ??????? (?????).  ???? ??????? ??????? ??????? ?????? ??????? ?? ????? ????? ??????? ??????? ???????? ???.  ????? ????? ???? ??? ????? ?? ???? ??? ???????? ????? ???????? ??????? ??????? ?????? ??????? ??. ????? ??????? ????????? ????? ?????????? ??????? ?? ???? ????? ??????? ????.  ?????? ???? ?? ????? ?????? ???????.  ???? ???????? ??????? ?????? ??? ???? ???? ?? ?????? ??? ?????? ????? ????? ?? ??? ??? ??? ????? ????? ?? ?? ????? ????? ?????. ??? ????? ?? ??? ????????? ?? ???? ?????? ????????? ???? ?????? ???? ??????? ??????. ???? ?? ?????? ??? ????? ???? ?? ???? ?? ???? ??????? ??????.? Document Revised: 01/29/2020 Document Reviewed: 01/29/2020 Elsevier Patient Education  2022 [employer].

## 2021-08-30 NOTE — Progress Notes (Signed)
Arabic interpreter [phone]  SUBJECTIVE:   CHIEF COMPLAINT / HPI:   Back Pain This is a chronic problem. The current episode started more than 1 year ago (Started about 3 to 4 yrs ago). The problem has been gradually worsening since onset. The pain is present in the thoracic spine and lumbar spine. The quality of the pain is described as aching. The pain is at a severity of 5/10 (Sometimes her pain can be as severe as 10/10). The pain is moderate. The symptoms are aggravated by position. (She has chronic constipation, otherwise, no other associated symptoms.) Risk factors: She denies hx of back injury. Treatments tried: She uses a pain medication, which does not work. The treatment provided moderate relief.  She had PT for 6 weeks with no improvement.  Breast pain:  C/O chronic left breast pain for which she got a U/S and mammogram done recently. The pain had not improved, but no change from baseline. She denies breast swelling, redness, or abnormal nipple discharge. She still breast feeds her 1.9 Y/O child. Pain is worse/ aggravated by not breastfeeding due to breast engorgement.  Nexplanon removal:  She wants her Nexplanon removed due to discomfort.  Anxiety:  She was recently started on Sertraline for this and she feels this has helped a lot. She is requesting a refill of this medication.  PERTINENT  PMH / PSH: PMHx reviewed  OBJECTIVE:   Vitals:   08/30/21 0936  BP: 113/60  Pulse: 70  SpO2: 100%  Weight: 144 lb 3.2 oz (65.4 kg)  Height: 5\' 9"  (1.753 m)    Physical Exam Vitals and nursing note reviewed.  Cardiovascular:     Rate and Rhythm: Normal rate and regular rhythm.     Heart sounds: Normal heart sounds. No murmur heard. Pulmonary:     Effort: Pulmonary effort is normal. No respiratory distress.     Breath sounds: Normal breath sounds. No wheezing.  Chest:     Comments: Deferred breast exam - no symptom change from baseline Musculoskeletal:     Thoracic back:  No tenderness. Normal range of motion.     Lumbar back: No tenderness. Normal range of motion.     Comments: Nexplanon implant palpable on her left inner arm, no erythema or swelling. Very mild, barely noticeable thoracic spine lateral curvation  Neurological:     General: No focal deficit present.     ASSESSMENT/PLAN:   Scoliosis I reviewed her lumbar and thoracic spine xray done in May of 2022. Both shows lumbar and thoracic scoliosis. As discussed with her, this is likely the cause of her back pain. Ibuprofen prescribed prn pain. I recommended OTC back brace and placed a referral to Sports medicine clinic. She agreed with the plan.    Left breast mass Chronic pain. I reviewed her breast mammogram and U/S which were both normal. I advised that sometimes breast engorgement in lactating mothers can cause pain. May use warm compression and Ibuprofen as needed for pain. Return soon especially if pain persists after weaning her child off breast milk or if symptoms worsens. She agreed with the plan.   Anxiety I refilled her Zoloft. Stable on her current regimen. F/U with PCP for medication adjustment as needed.  Flowsheet Row Office Visit from 08/30/2021 in [facility]  PHQ-9 Total Score 0        Contraceptive management [facility] clinic appointment made for her during this visit for Nexplanon removal. She is aware of the date and  time.      Janit Pagan, MD [facility]

## 2021-08-30 NOTE — Assessment & Plan Note (Signed)
I refilled her Zoloft. Stable on her current regimen. F/U with PCP for medication adjustment as needed.  Flowsheet Row Office Visit from 08/30/2021 in [facility]  PHQ-9 Total Score 0

## 2021-08-30 NOTE — Assessment & Plan Note (Signed)
I reviewed her lumbar and thoracic spine xray done in May of 2022. Both shows lumbar and thoracic scoliosis. As discussed with her, this is likely the cause of her back pain. Ibuprofen prescribed prn pain. I recommended OTC back brace and placed a referral to Sports medicine clinic. She agreed with the plan.

## 2021-09-01 IMAGING — MG DIGITAL DIAGNOSTIC BILAT W/ TOMO W/ CAD
8 of 14 series · 8 of 40 positions shown · non-contrast
Comparison: None.

CLINICAL DATA: Patient presents with a palpable lump in the inner
left breast. Patient also reports some occasional tenderness lateral
breast. Patient is currently breast feeding the 16 months.

EXAM:
DIGITAL DIAGNOSTIC BILATERAL MAMMOGRAM WITH TOMOSYNTHESIS AND CAD;
ULTRASOUND LEFT BREAST LIMITED
TECHNIQUE: Bilateral digital diagnostic mammography and breast tomosynthesis
was performed. The images were evaluated with computer-aided
detection.; Targeted ultrasound examination of the left breast was
performed

[L CC synth-2D (1 of 2)]
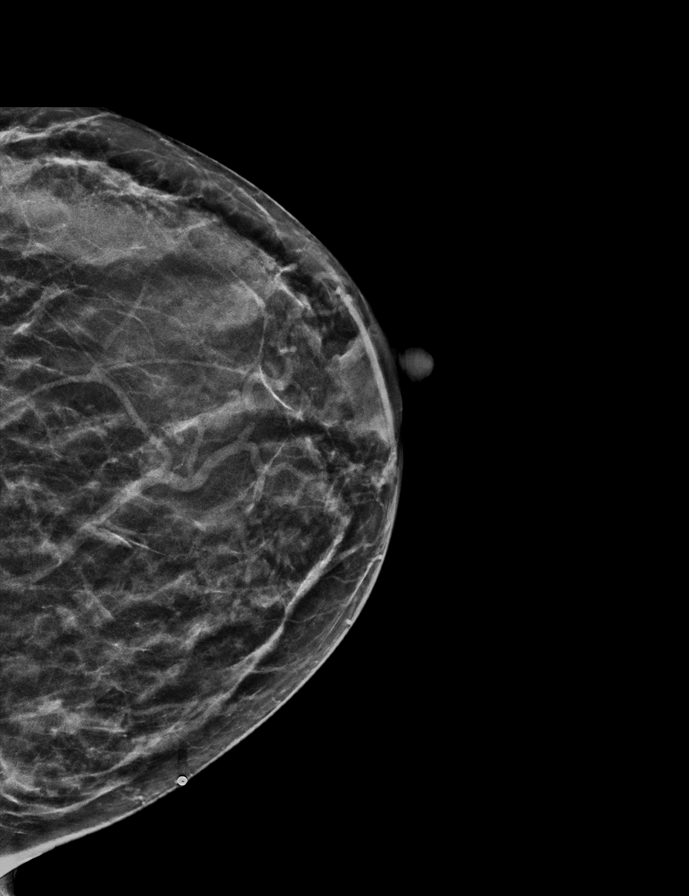

[R XCCL synth-2D]
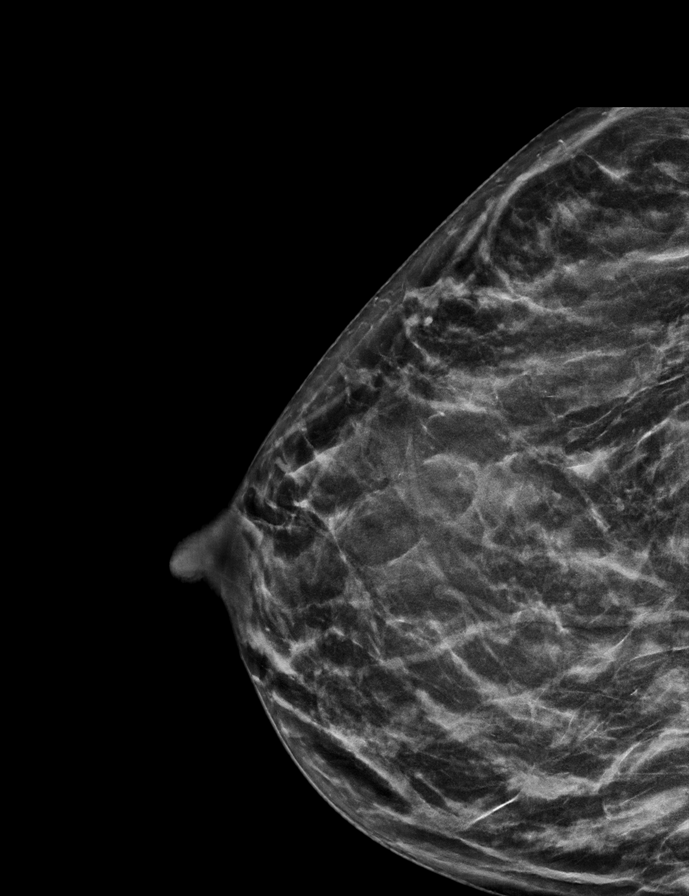

[R CC synth-2D]
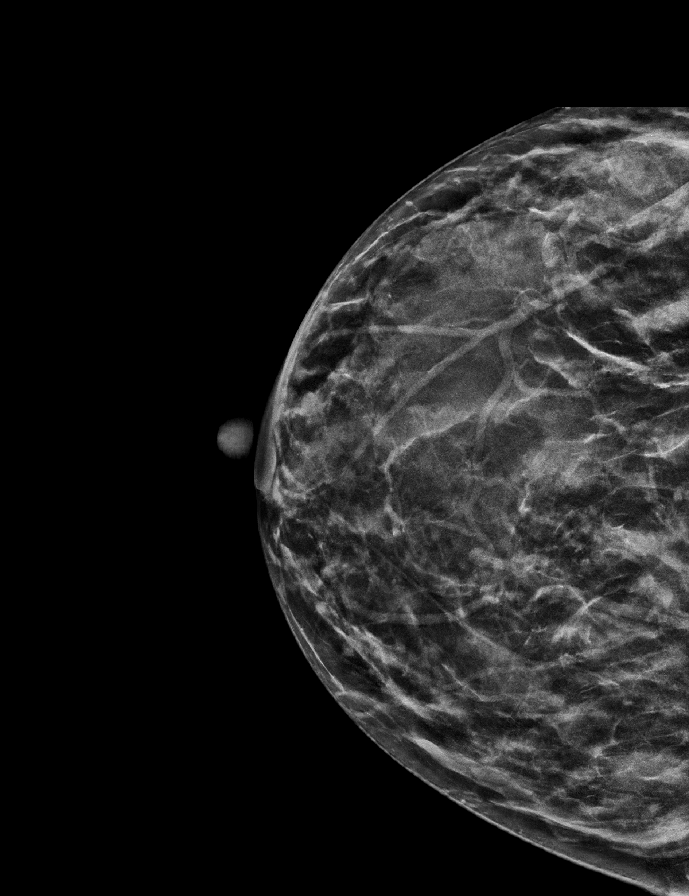

[L MLO synth-2D]
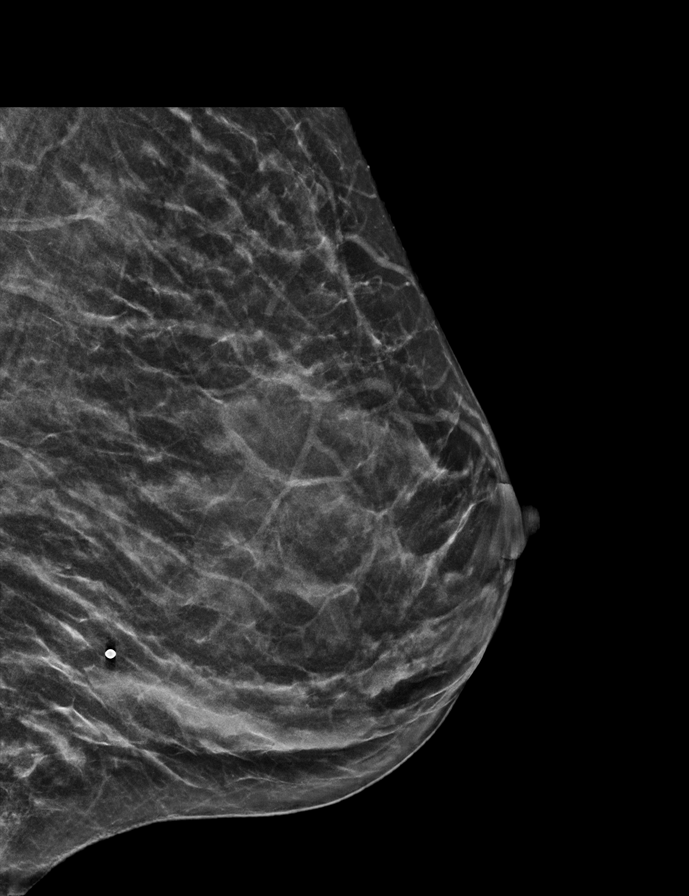

[R MLO synth-2D]
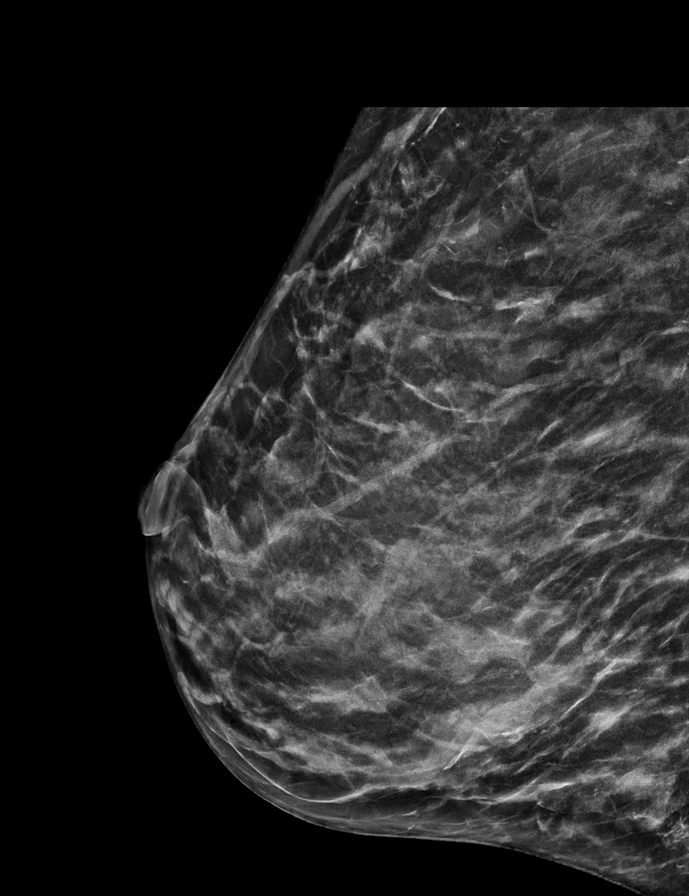

[L XCCL synth-2D]
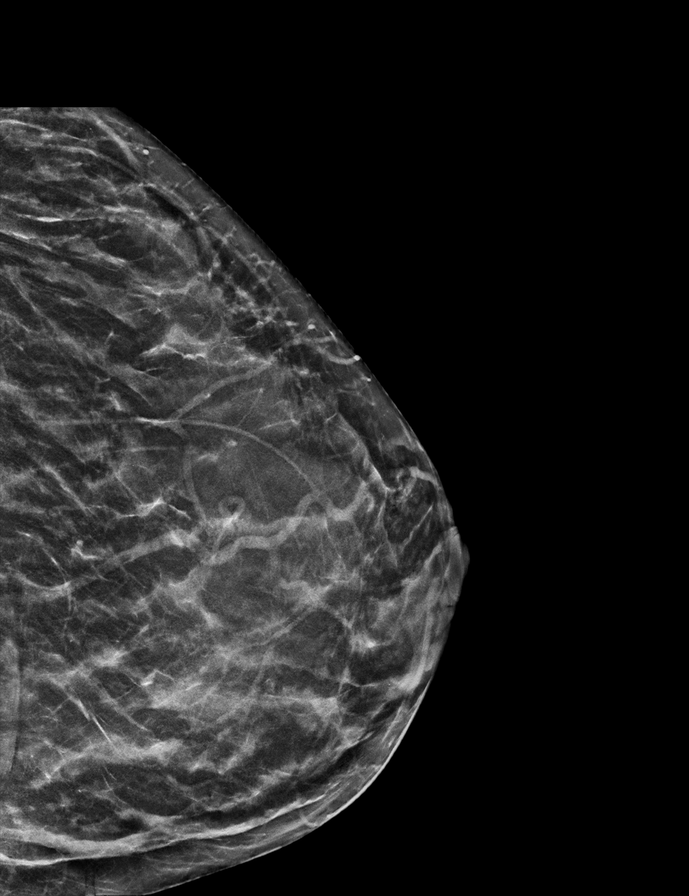

[L CC synth-2D (2 of 2)]
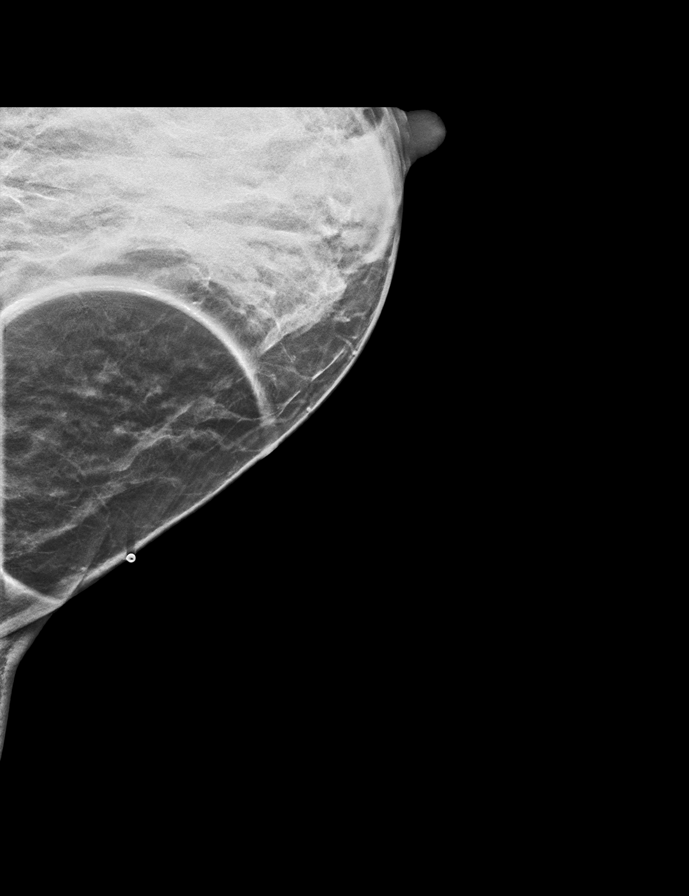

[L XCCL tomo · tomo slice 23/46.0]
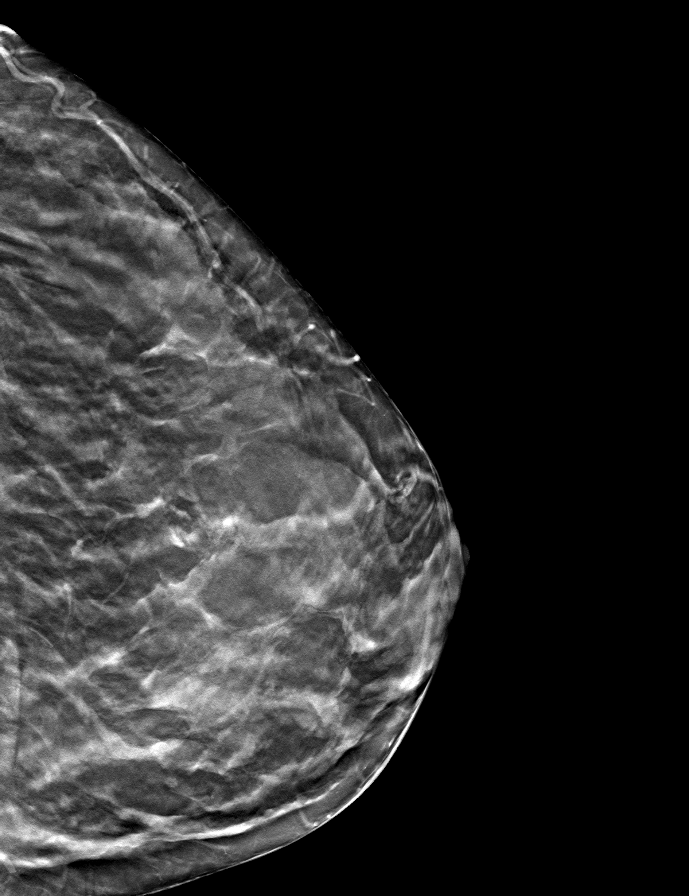

[8 of 40 positions shown; findings below may reference images not displayed]

ACR Breast Density Category c: The breast tissue is heterogeneously
dense, which may obscure small masses.
FINDINGS: There are no masses, areas of architectural distortion, areas of
significant asymmetry or suspicious calcifications.

Targeted ultrasound is performed, showing an area normal tissue,
with a somewhat prominent fat lobule, in the left breast at 8
o'clock, 9 cm the nipple, corresponding to the area of abnormality.
No defined mass or cyst. No suspicious lesion.
IMPRESSION: Negative exam.  No evidence of breast malignancy.

RECOMMENDATION:
Screening mammogram at age 42 unless there are persistent or
intervening clinical concerns. (Code:2F-J-K1D)

I have discussed the findings and recommendations with the patient.
If applicable, a reminder letter will be sent to the patient
regarding the next appointment.

BI-RADS CATEGORY  1: Negative.

## 2021-09-09 NOTE — Telephone Encounter (Signed)
Error

## 2021-09-15 ENCOUNTER — Other Ambulatory Visit: Payer: Self-pay

## 2021-09-15 ENCOUNTER — Ambulatory Visit: Payer: Medicaid Other | Admitting: Family Medicine

## 2021-09-15 VITALS — BP 98/64 | HR 66 | Wt 140.0 lb

## 2021-09-15 DIAGNOSIS — Z308 Encounter for other contraceptive management: Secondary | ICD-10-CM

## 2021-09-15 DIAGNOSIS — Z3046 Encounter for surveillance of implantable subdermal contraceptive: Secondary | ICD-10-CM

## 2021-09-15 NOTE — Patient Instructions (Signed)
We removed your implantable contraceptive device today.  You may have some mild soreness and bruising at the site for the next 1 to 2 days and that should resolve.  You are now fertile and can conceive.

## 2021-09-15 NOTE — Progress Notes (Signed)
PROCEDURE NOTE: NEXPLANON  REMOVAL °Patient given informed consent and signed copy in the chart. LEFT arm area prepped and draped in the usual sterile fashion. Three cc of lidocaine without epinephrine 1% used for local anesthesia. A small stab incision was made close to the nexplanon with scalpel. Hemostats were used to withdraw the nexplanon. A small bandage was applied over a steri strip  No complications.Patient given follow up instructions should she experience redness, swelling at sight or fever in the next 24 hours. Patient was reminded this totally removes her nexplanon contraceptive devise. (she can now potentially conceive)  °

## 2022-01-29 DIAGNOSIS — R7303 Prediabetes: Secondary | ICD-10-CM | POA: Insufficient documentation

## 2022-07-12 ENCOUNTER — Other Ambulatory Visit: Payer: Self-pay | Admitting: Internal Medicine

## 2022-07-12 DIAGNOSIS — M545 Low back pain, unspecified: Secondary | ICD-10-CM

## 2022-07-14 ENCOUNTER — Other Ambulatory Visit: Payer: Self-pay

## 2022-07-14 ENCOUNTER — Inpatient Hospital Stay (HOSPITAL_COMMUNITY)
Admission: AD | Admit: 2022-07-14 | Discharge: 2022-07-14 | Disposition: A | Discharge status: Home / Self Care | Payer: Medicaid Other | Attending: Obstetrics and Gynecology | Admitting: Obstetrics and Gynecology

## 2022-07-14 ENCOUNTER — Encounter (HOSPITAL_COMMUNITY): Payer: Self-pay

## 2022-07-14 DIAGNOSIS — R519 Headache, unspecified: Secondary | ICD-10-CM | POA: Insufficient documentation

## 2022-07-14 DIAGNOSIS — K5900 Constipation, unspecified: Secondary | ICD-10-CM | POA: Diagnosis not present

## 2022-07-14 DIAGNOSIS — Z3A01 Less than 8 weeks gestation of pregnancy: Secondary | ICD-10-CM | POA: Diagnosis not present

## 2022-07-14 DIAGNOSIS — O26891 Other specified pregnancy related conditions, first trimester: Secondary | ICD-10-CM | POA: Diagnosis present

## 2022-07-14 DIAGNOSIS — O219 Vomiting of pregnancy, unspecified: Secondary | ICD-10-CM | POA: Diagnosis not present

## 2022-07-14 DIAGNOSIS — O99611 Diseases of the digestive system complicating pregnancy, first trimester: Secondary | ICD-10-CM | POA: Insufficient documentation

## 2022-07-14 DIAGNOSIS — G8929 Other chronic pain: Secondary | ICD-10-CM

## 2022-07-14 LAB — URINALYSIS, ROUTINE W REFLEX MICROSCOPIC
Bilirubin Urine: NEGATIVE
Glucose, UA: NEGATIVE mg/dL
Hgb urine dipstick: NEGATIVE
Ketones, ur: NEGATIVE mg/dL
Leukocytes,Ua: NEGATIVE
Nitrite: NEGATIVE
Protein, ur: NEGATIVE mg/dL
Specific Gravity, Urine: 1.02 (ref 1.005–1.030)
pH: 7 (ref 5.0–8.0)

## 2022-07-14 LAB — POCT PREGNANCY, URINE: Preg Test, Ur: POSITIVE — AB

## 2022-07-14 MED ORDER — ONDANSETRON HCL 4 MG PO TABS: 4.0000 mg | ORAL_TABLET | Freq: Three times a day (TID) | ORAL | 2 refills | Status: DC | PRN

## 2022-07-14 MED ORDER — ONDANSETRON HCL 4 MG PO TABS
4.0000 mg | ORAL_TABLET | Freq: Once | ORAL | Status: AC
  Administered 2022-07-14: 4 mg via ORAL
  Filled 2022-07-14: qty 1

## 2022-07-14 MED ORDER — ACETAMINOPHEN 500 MG PO TABS: 1000.0000 mg | ORAL_TABLET | Freq: Four times a day (QID) | ORAL | 2 refills | Status: DC | PRN

## 2022-07-14 MED ORDER — DOCUSATE SODIUM 100 MG PO CAPS: 100.0000 mg | ORAL_CAPSULE | Freq: Two times a day (BID) | ORAL | 2 refills | Status: DC | PRN

## 2022-07-14 MED ORDER — ACETAMINOPHEN 500 MG PO TABS
1000.0000 mg | ORAL_TABLET | Freq: Once | ORAL | Status: AC
  Administered 2022-07-14: 1000 mg via ORAL
  Filled 2022-07-14: qty 2

## 2022-07-14 NOTE — MAU Note (Signed)
[facility] Mapes is a 41 y.o. at [redacted]w[redacted]d here in MAU reporting: having nausea and a headache. This is the 3 or 4th day of the symptoms. No emesis.   LMP: 05/29/22 approximately  Onset of complaint: ongoing  Pain score: 10/10  Vitals:   07/14/22 0934  BP: (!) 106/49  Pulse: 79  Resp: 16  Temp: 98.3 F (36.8 C)  SpO2: 100%     FHT: NA  Lab orders placed from triage: upt, ua

## 2022-07-14 NOTE — Discharge Instructions (Signed)
[facility] Providers   [facility]             [address], [place], [place] [address] [phone]  [facility]                                                             [address], [address], [place], [place], [address] [phone]  [facility]                                    [address], [address], [place], [place], [address] [phone]  [facility] [place] [address], [place], [place], [address] [phone]  [facility]                                 [address], [place], [place], [address] [phone]  [facility]                                    [address], [place], [place], [address] [phone]  [facility] [address], [address], [place], [place], [address]                              [facility] [address], , [place], [address] [phone]  [facility]         Phone: [phone]  [facility]      Phone: [phone]   [facility]      Phone: [phone]  [facility]         Phone: [phone]   [facility]    Phone: [phone]  [facility]      Phone: [phone]  [facility]      Phone: [phone]  [facility]        Phone: [phone]  [facility] (Sheronette Cousins) [phone]  [facility]      Phone: [phone]   

## 2022-07-14 NOTE — MAU Provider Note (Signed)
Chief Complaint: Headache and Nausea   Event Date/Time   First Provider Initiated Contact with Patient 07/14/22 1021     SUBJECTIVE HPI: Lauren Ellis is a 41 y.o. G1P0 at [redacted]w[redacted]d by LMP who presents to Maternity Admissions reporting nausea and generalized 10/10 HA x several days. Has not vomited. Has not tried anything for either symptom.    Modifying factors: Hasn't tried anything Associated signs and symptoms: Neg for fever, chills, diarrhea, constipation, urinary complaints, vision changes, difficulty w/ speech or gait. No thunderclap HA.   Past Medical History:  Diagnosis Date   Chronic lower back pain    occasional sciatica on right   OB History  Gravida Para Term Preterm AB Living  1            SAB IAB Ectopic Multiple Live Births               # Outcome Date GA Lbr Len/2nd Weight Sex Delivery Anes PTL Lv  1 Current            Past Surgical History:  Procedure Laterality Date   CESAREAN SECTION     Social History   Socioeconomic History   Marital status: Married    Spouse name: Muhsin Financial controller   Number of children: 4   Years of education: Not on file   Highest education level: 3rd grade  Occupational History   Occupation: stay at home mom  Tobacco Use   Smoking status: Never   Smokeless tobacco: Never  Vaping Use   Vaping Use: Never used  Substance and Sexual Activity   Alcohol use: Never   Drug use: Never   Sexual activity: Not on file    Comment: Nexplanon inserted in 2021  Other Topics Concern   Not on file  Social History Narrative   Here with husband and 4 children   Social Determinants of Health   Financial Resource Strain: Not on file  Food Insecurity: Not on file  Transportation Needs: Not on file  Physical Activity: Not on file  Stress: Not on file  Social Connections: Not on file  Intimate Partner Violence: Not on file   No family history on file. No current facility-administered medications on file prior to encounter.    Current Outpatient Medications on File Prior to Encounter  Medication Sig Dispense Refill   ibuprofen (ADVIL) 400 MG tablet Take 1 tablet (400 mg total) by mouth every 12 (twelve) hours as needed. (Patient not taking: Reported on 09/15/2021) 30 tablet 0   lidocaine (XYLOCAINE) 5 % ointment Apply 1 application topically as needed. 240 g 2   omeprazole (PRILOSEC) 20 MG capsule Take 1 capsule (20 mg total) by mouth daily. (Patient not taking: Reported on 09/15/2021) 30 capsule 3   polyethylene glycol powder (GLYCOLAX/MIRALAX) 17 GM/SCOOP powder Take 17 g by mouth daily. (Patient not taking: Reported on 06/02/2021) 500 g 0   sertraline (ZOLOFT) 25 MG tablet Take 1 tablet (25 mg total) by mouth daily. 30 tablet 0   No Known Allergies  I have reviewed patient's Past Medical Hx, Surgical Hx, Family Hx, Social Hx, medications and allergies.   Review of Systems  Constitutional:  Negative for chills and fever.  HENT:  Negative for sinus pressure.   Gastrointestinal:  Positive for nausea. Negative for abdominal pain, diarrhea and vomiting.  Genitourinary:  Negative for vaginal bleeding.  Neurological:  Positive for headaches. Negative for speech difficulty.    OBJECTIVE Patient Vitals for the past 24 hrs:  BP Temp Temp src Pulse Resp SpO2 Weight  07/14/22 0934 (!) 106/49 98.3 F (36.8 C) Oral 79 16 100 % --  07/14/22 0929 -- -- -- -- -- -- 65 kg   Constitutional: Well-developed, well-nourished female in no acute distress.  Cardiovascular: normal rate Respiratory: normal rate and effort.  GI: Deferred Neurologic: Alert and oriented x 4. Nml speech and gait. GU: Deferred  LAB RESULTS Results for orders placed or performed during the hospital encounter of 07/14/22 (from the past 24 hour(s))  Urinalysis, Routine w reflex microscopic     Status: Abnormal   Collection Time: 07/14/22  9:22 AM  Result Value Ref Range   Color, Urine YELLOW YELLOW   APPearance HAZY (A) CLEAR   Specific Gravity,  Urine 1.020 1.005 - 1.030   pH 7.0 5.0 - 8.0   Glucose, UA NEGATIVE NEGATIVE mg/dL   Hgb urine dipstick NEGATIVE NEGATIVE   Bilirubin Urine NEGATIVE NEGATIVE   Ketones, ur NEGATIVE NEGATIVE mg/dL   Protein, ur NEGATIVE NEGATIVE mg/dL   Nitrite NEGATIVE NEGATIVE   Leukocytes,Ua NEGATIVE NEGATIVE  Pregnancy, urine POC     Status: Abnormal   Collection Time: 07/14/22  9:25 AM  Result Value Ref Range   Preg Test, Ur POSITIVE (A) NEGATIVE    IMAGING No results found.  MAU COURSE Orders Placed This Encounter  Procedures   Urinalysis, Routine w reflex microscopic Urine, Clean Catch   Pregnancy, urine POC   Discharge patient   Meds ordered this encounter  Medications   ondansetron (ZOFRAN) tablet 4 mg   acetaminophen (TYLENOL) tablet 1,000 mg   acetaminophen (TYLENOL) 500 MG tablet    Sig: Take 2 tablets (1,000 mg total) by mouth every 6 (six) hours as needed for moderate pain or headache.    Dispense:  60 tablet    Refill:  2    Order Specific Question:   Supervising Provider    Answer:   Mariel Aloe A [[id number]]   docusate sodium (COLACE) 100 MG capsule    Sig: Take 1 capsule (100 mg total) by mouth 2 (two) times daily as needed for mild constipation.    Dispense:  120 capsule    Refill:  2    Order Specific Question:   Supervising Provider    Answer:   Mariel Aloe A [[id number]]   ondansetron (ZOFRAN) 4 MG tablet    Sig: Take 1 tablet (4 mg total) by mouth every 8 (eight) hours as needed for nausea or vomiting.    Dispense:  30 tablet    Refill:  2    Order Specific Question:   Supervising Provider    Answer:   Mariel Aloe A [[id number]]    MDM - HA improved to 2/10. Tolerating PO's.   ASSESSMENT 1. Nausea and vomiting during pregnancy   2. Headache in pregnancy, antepartum, first trimester   3. [redacted] weeks gestation of pregnancy   4. Constipation during pregnancy in first trimester     PLAN Discharge home in stable condition. First trimester precautions List of  providers given  Follow-up Information     Obstetrician of your choice Follow up.   Why: Start prenatal care        [facility] Follow up.   Specialty: Obstetrics and Gynecology Why: As needed in emergencies Contact information: [address] [id number] Wilhemina Bonito [place] [address] [phone]               Allergies as of 07/14/2022  No Known Allergies      Medication List     STOP taking these medications    ibuprofen 400 MG tablet Commonly known as: ADVIL   lidocaine 5 % ointment Commonly known as: XYLOCAINE   omeprazole 20 MG capsule Commonly known as: PRILOSEC   polyethylene glycol powder 17 GM/SCOOP powder Commonly known as: GLYCOLAX/MIRALAX   sertraline 25 MG tablet Commonly known as: Zoloft       TAKE these medications    acetaminophen 500 MG tablet Commonly known as: TYLENOL Take 2 tablets (1,000 mg total) by mouth every 6 (six) hours as needed for moderate pain or headache. What changed:  medication strength how much to take reasons to take this   docusate sodium 100 MG capsule Commonly known as: COLACE Take 1 capsule (100 mg total) by mouth 2 (two) times daily as needed for mild constipation. What changed:  medication strength how much to take when to take this   ondansetron 4 MG tablet Commonly known as: ZOFRAN Take 1 tablet (4 mg total) by mouth every 8 (eight) hours as needed for nausea or vomiting.         Tamala Julian, [place], [place] 07/14/2022  11:46 AM

## 2022-07-27 ENCOUNTER — Other Ambulatory Visit: Payer: Self-pay

## 2022-07-27 ENCOUNTER — Ambulatory Visit: Payer: Medicaid Other | Admitting: *Deleted

## 2022-07-27 VITALS — BP 100/64 | HR 62 | Ht 70.0 in | Wt 138.4 lb

## 2022-07-27 DIAGNOSIS — O0990 Supervision of high risk pregnancy, unspecified, unspecified trimester: Secondary | ICD-10-CM | POA: Insufficient documentation

## 2022-07-27 DIAGNOSIS — Z98891 History of uterine scar from previous surgery: Secondary | ICD-10-CM | POA: Insufficient documentation

## 2022-07-27 DIAGNOSIS — O219 Vomiting of pregnancy, unspecified: Secondary | ICD-10-CM

## 2022-07-27 DIAGNOSIS — Z789 Other specified health status: Secondary | ICD-10-CM

## 2022-07-27 DIAGNOSIS — O09529 Supervision of elderly multigravida, unspecified trimester: Secondary | ICD-10-CM | POA: Insufficient documentation

## 2022-07-27 DIAGNOSIS — Z603 Acculturation difficulty: Secondary | ICD-10-CM | POA: Insufficient documentation

## 2022-07-27 HISTORY — DX: History of uterine scar from previous surgery: Z98.891

## 2022-07-27 HISTORY — DX: Acculturation difficulty: Z60.3

## 2022-07-27 MED ORDER — GOJJI WEIGHT SCALE MISC: 1.0000 | 0 refills | Status: DC | PRN

## 2022-07-27 MED ORDER — PROMETHAZINE HCL 25 MG PO TABS: 25.0000 mg | ORAL_TABLET | Freq: Four times a day (QID) | ORAL | 1 refills | Status: DC | PRN

## 2022-07-27 MED ORDER — BLOOD PRESSURE KIT DEVI: 1.0000 | 0 refills | Status: DC | PRN

## 2022-07-27 NOTE — Progress Notes (Signed)
New OB Intake  Here in office for appointment.  I explained I am completing New OB Intake today. We discussed her EDD of 03/05/23 that is based on LMP of around 05/29/22-states it was around 8/21-8/24. Pt is G5/P4004. I reviewed her allergies, medications, Medical/Surgical/OB history, and appropriate screenings. I informed her of [facility] services. [facility] information placed in AVS. She requests a different nausea med because she states the zofran is not helping. RX for phenergan per protocol sent to pharmacy. She c/o mild pelvic cramping and spotted x2. Reviewed ectopic precautions with her. Advised tylenol as needed. Based on history, this is a/an  pregnancy complicated by AMA, history C/S, multigravida  .   Patient Active Problem List   Diagnosis Date Noted   AMA (advanced maternal age) multigravida 35+ 07/27/2022   Supervision of high risk pregnancy, antepartum 07/27/2022   History of C-section 07/27/2022   Language barrier 07/27/2022   Scoliosis 08/30/2021   Anxiety 08/30/2021   Skin lesion of scalp 07/14/2021   SOB (shortness of breath) 05/26/2021   Heart burn 05/26/2021   Dysuria 02/16/2021   Left breast mass 01/25/2021   Constipation 01/25/2021   Chronic midline low back pain 01/25/2021   Palpitations 01/25/2021    Concerns addressed today  Delivery Plans Plans to deliver at [facility]. Patient given information for [facility] Healthy Baby website for more information about Women's and [place]. Patient is not candidate for waterbirth due to HX C/S.   MyChart/Babyscripts  I explained pt will have some visits in office and some virtually. Explained she can sign up for MyChart if she chooses.   Blood Pressure Cuff/Weight Scale Blood pressure cuff ordered for patient to pick-up from [employer]. Explained after first prenatal appt pt will check weekly and document in 66. Patient does / does not  have weight scale. Weight scale ordered for patient to pick up from [employer].   Anatomy US Explained first scheduled [place] will be around 19 weeks. Anatomy US scheduled for 10/10/22 at 1030. Pt notified to arrive at 1015.  Labs Discussed Johnsie Cancel genetic screening with patient. Will decide by new ob as she is only [redacted]w[redacted]d today and cannot draw genetic testing today.  Routine prenatal labs drawn today.   Covid Vaccine Patient has had the covid vaccine.   Is patient a CenteringPregnancy candidate?  Not a Candidate Declined due to Other language  Is patient a Mom+Baby Combined Care candidate?  Not a candidate    Social Determinants of Health Food Insecurity: Patient expresses food insecurity. Food Market information given to patient; explained patient may visit at the end of first OB appointment. WIC Referral: Patient is interested in referral to [facility].  Transportation: Patient denies transportation needs. Childcare: Discussed no children allowed at ultrasound appointments. Offered childcare services; patient declines childcare services at this time.  First visit review I reviewed new OB appt with pt. I explained she will have a provider visit that includes exam, additional labs if needed. Explained pt will be seen by Dr. Simmie Davies at first visit; encounter routed to appropriate provider. Explained that patient will be seen by pregnancy navigator following visit with provider.   Staci Acosta 07/27/2022  1:59 PM

## 2022-07-27 NOTE — Patient Instructions (Signed)
  At our [facility], we work as an integrated team, providing care to address both physical and emotional health. Your medical provider may refer you to see our Behavioral Health Clinician (BHC) on the same day you see your medical provider, as availability permits; often scheduled virtually at your convenience.  Our BHC is available to all patients, visits generally last between 20-30 minutes, but can be longer or shorter, depending on patient need. The BHC offers help with stress management, coping with symptoms of depression and anxiety, major life changes , sleep issues, changing risky behavior, grief and loss, life stress, working on personal life goals, and  behavioral health issues, as these all affect your overall health and wellness.  The BHC is NOT available for the following: FMLA paperwork, court-ordered evaluations, specialty assessments (custody or disability), letters to employers, or obtaining certification for an emotional support animal. The BHC does not provide long-term therapy. You have the right to refuse integrated behavioral health services, or to reschedule to see the BHC at a later date.  Confidentiality exception: If it is suspected that a child or disabled adult is being abused or neglected, we are required by law to report that to either Child Protective Services or Adult Protective Services.  If you have a diagnosis of Bipolar affective disorder, Schizophrenia, or recurrent Major depressive disorder, we will recommend that you establish care with a psychiatrist, as these are lifelong, chronic conditions, and we want your overall emotional health and medications to be more closely monitored. If you anticipate needing extended maternity leave due to mental health issues postpartum, it it recommended you inform your medical provider, so we can put in a referral to a psychiatrist as soon as possible. The BHC is unable to recommend an extended maternity leave for mental  health issues. Your medical provider or BHC may refer you to a therapist for ongoing, traditional therapy, or to a psychiatrist, for medication management, if it would benefit your overall health. Depending on your insurance, you may have a copay or be charged a deductible, depending on your insurance, to see the BHC. If you are uninsured, it is recommended that you apply for financial assistance. (Forms may be requested at the front desk for in-person visits, via MyChart, or request a form during a virtual visit).  If you see the BHC more than 6 times, you will have to complete a comprehensive clinical assessment interview with the BHC to resume integrated services.  For virtual visits with the BHC, you must be physically in the state of [place] at the time of the visit. For example, if you live in [place], you will have to do an in-person visit with the BHC, and your out-of-state insurance may not cover behavioral health services in [place]. If you are going out of the state or country for any reason, the BHC may see you virtually when you return to [place], but not while you are physically outside of [place].    [email or web address] is a website with information to help you prepare for Labor and delivery, patient information, visitor information and more.    Here is a link to the Pregnancy Navigators . Please Fill out prior to your New OB appointment.   English Link: [email or web address]  Spanish Link: [email or web address]  

## 2022-07-28 LAB — CBC/D/PLT+RPR+RH+ABO+RUBIGG...
Antibody Screen: NEGATIVE
Basophils Absolute: 0 10*3/uL (ref 0.0–0.2)
Basos: 1 %
EOS (ABSOLUTE): 0.3 10*3/uL (ref 0.0–0.4)
Eos: 5 %
HCV Ab: NONREACTIVE
HIV Screen 4th Generation wRfx: NONREACTIVE
Hematocrit: 36 % (ref 34.0–46.6)
Hemoglobin: 12.4 g/dL (ref 11.1–15.9)
Hepatitis B Surface Ag: NEGATIVE
Immature Grans (Abs): 0 10*3/uL (ref 0.0–0.1)
Immature Granulocytes: 0 %
Lymphocytes Absolute: 1.7 10*3/uL (ref 0.7–3.1)
Lymphs: 36 %
MCH: 31.6 pg (ref 26.6–33.0)
MCHC: 34.4 g/dL (ref 31.5–35.7)
MCV: 92 fL (ref 79–97)
Monocytes Absolute: 0.3 10*3/uL (ref 0.1–0.9)
Monocytes: 6 %
Neutrophils Absolute: 2.6 10*3/uL (ref 1.4–7.0)
Neutrophils: 52 %
Platelets: 205 10*3/uL (ref 150–450)
RBC: 3.93 x10E6/uL (ref 3.77–5.28)
RDW: 12.4 % (ref 11.7–15.4)
RPR Ser Ql: NONREACTIVE
Rh Factor: POSITIVE
Rubella Antibodies, IGG: 28.6 index (ref >0.99)
WBC: 4.9 10*3/uL (ref 3.4–10.8)

## 2022-07-28 LAB — HEMOGLOBIN A1C
Est. average glucose Bld gHb Est-mCnc: 114 mg/dL
Hgb A1c MFr Bld: 5.6 % (ref 4.8–5.6)

## 2022-07-28 LAB — HCV INTERPRETATION

## 2022-07-29 LAB — URINE CULTURE, OB REFLEX

## 2022-07-29 LAB — CULTURE, OB URINE

## 2022-08-01 ENCOUNTER — Other Ambulatory Visit (HOSPITAL_COMMUNITY): Payer: Self-pay | Admitting: Internal Medicine

## 2022-08-01 DIAGNOSIS — R0600 Dyspnea, unspecified: Secondary | ICD-10-CM

## 2022-08-02 ENCOUNTER — Encounter (HOSPITAL_COMMUNITY): Payer: Self-pay | Admitting: Internal Medicine

## 2022-08-10 ENCOUNTER — Telehealth (HOSPITAL_COMMUNITY): Payer: Self-pay | Admitting: Internal Medicine

## 2022-08-10 NOTE — Telephone Encounter (Signed)
Just an FYI. We have made several attempts to contact this patient including sending a letter to schedule or reschedule their echocardiogram. We will be removing the patient from the echo [place].  08/02/22 Called w/ Interpreter and no VM is set up @ 10:02- WIll mail letter to call office /[facility]   Order will be removed from the [place].

## 2022-08-14 ENCOUNTER — Telehealth: Payer: Self-pay | Admitting: Family Medicine

## 2022-08-14 NOTE — Telephone Encounter (Signed)
Patient requesting a Rx for Nausea   Lauren Ellis on [employer]

## 2022-08-14 NOTE — Telephone Encounter (Signed)
Called pt w/[employer] interpreter # [phone] and discussed pt's concern. I advised that she has refills available and just needs to request from her pharmacy. Pt states she is using the Zofran only because it works better.  She denies constipation and is taking Colace as directed. Pt had no additional questions.

## 2022-08-27 NOTE — Progress Notes (Unsigned)
   PRENATAL VISIT NOTE  Subjective:  [address] [name] is a 42 y.o. [phone] at [redacted]w[redacted]d being seen today for ongoing prenatal care.  She is currently monitored for the following issues for this {Blank single:19197::"high-risk","low-risk"} pregnancy and has Left breast mass; Constipation; Low back pain; Palpitations; Dysuria; SOB (shortness of breath); Heart burn; Skin lesion of scalp; Scoliosis of lumbar spine; Anxiety; AMA (advanced maternal age) multigravida 35+; Supervision of high risk pregnancy, antepartum; History of C-section; Language barrier; and Prediabetes on their problem list.  Patient reports {sx:14538}. ***   .  .   . Denies leaking of fluid.   The following portions of the patient's history were reviewed and updated as appropriate: allergies, current medications, past family history, past medical history, past social history, past surgical history and problem list.   Objective:  There were no vitals filed for this visit.  Fetal Status:           General:  Alert, oriented and cooperative. Patient is in no acute distress.  Skin: Skin is warm and dry. No rash noted.   Cardiovascular: Normal heart rate noted  Respiratory: Normal respiratory effort, no problems with respiration noted  Abdomen: Soft, gravid, appropriate for gestational age.        Pelvic: {Blank single:19197::"Cervical exam performed in the presence of a chaperone","Cervical exam deferred"}        Extremities: Normal range of motion.     Mental Status: Normal mood and affect. Normal behavior. Normal judgment and thought content.   Assessment and Plan:  Pregnancy: G5P4004 at [redacted]w[redacted]d  1. Multigravida of advanced maternal age in second trimester  2. History of C-section  3. Language barrier  4. Supervision of high risk pregnancy, antepartum    {Blank single:19197::"Term","Preterm"} labor symptoms and general obstetric precautions including but not limited to vaginal bleeding, contractions, leaking  of fluid and fetal movement were reviewed in detail with the patient. Please refer to After Visit Summary for other counseling recommendations.   No follow-ups on file.  Future Appointments  Date Time Provider Department Center  08/28/2022  8:55 AM Lauren Hawk, DO [facility]  09/14/2022  8:35 AM [facility] ECHO 1 [facility]  10/10/2022 10:15 AM [facility] NURSE [facility]  10/10/2022 10:30 AM [facility] US3 [facility]    Lauren Hawk, DO FMOB Fellow, Faculty practice [facility] 08/27/22  8:26 PM

## 2022-08-28 ENCOUNTER — Encounter: Payer: Self-pay | Admitting: Family Medicine

## 2022-08-28 ENCOUNTER — Ambulatory Visit (INDEPENDENT_AMBULATORY_CARE_PROVIDER_SITE_OTHER): Payer: Medicaid Other | Admitting: Family Medicine

## 2022-08-28 ENCOUNTER — Encounter: Payer: Self-pay | Admitting: *Deleted

## 2022-08-28 ENCOUNTER — Other Ambulatory Visit (HOSPITAL_COMMUNITY)
Admission: RE | Admit: 2022-08-28 | Discharge: 2022-08-28 | Disposition: A | Discharge status: Home / Self Care | Payer: Medicaid Other | Source: Ambulatory Visit | Attending: Family Medicine | Admitting: Family Medicine

## 2022-08-28 VITALS — BP 99/56 | HR 76 | Wt 142.8 lb

## 2022-08-28 DIAGNOSIS — Z789 Other specified health status: Secondary | ICD-10-CM | POA: Diagnosis not present

## 2022-08-28 DIAGNOSIS — Z23 Encounter for immunization: Secondary | ICD-10-CM | POA: Diagnosis not present

## 2022-08-28 DIAGNOSIS — Z98891 History of uterine scar from previous surgery: Secondary | ICD-10-CM | POA: Diagnosis not present

## 2022-08-28 DIAGNOSIS — O09522 Supervision of elderly multigravida, second trimester: Secondary | ICD-10-CM | POA: Insufficient documentation

## 2022-08-28 DIAGNOSIS — O0990 Supervision of high risk pregnancy, unspecified, unspecified trimester: Secondary | ICD-10-CM | POA: Insufficient documentation

## 2022-08-28 DIAGNOSIS — Z3A12 12 weeks gestation of pregnancy: Secondary | ICD-10-CM

## 2022-08-28 LAB — POCT URINALYSIS DIP (DEVICE)
Bilirubin Urine: NEGATIVE
Glucose, UA: NEGATIVE mg/dL
Ketones, ur: NEGATIVE mg/dL
Leukocytes,Ua: NEGATIVE
Nitrite: NEGATIVE
Protein, ur: NEGATIVE mg/dL
Specific Gravity, Urine: >=1.03 (ref 1.005–1.030)
Urobilinogen, UA: 1 mg/dL (ref 0.0–1.0)
pH: 6 (ref 5.0–8.0)

## 2022-08-29 ENCOUNTER — Other Ambulatory Visit: Payer: Self-pay | Admitting: Family Medicine

## 2022-08-29 DIAGNOSIS — B3731 Acute candidiasis of vulva and vagina: Secondary | ICD-10-CM

## 2022-08-29 LAB — CERVICOVAGINAL ANCILLARY ONLY
Bacterial Vaginitis (gardnerella): NEGATIVE
Candida Glabrata: NEGATIVE
Candida Vaginitis: POSITIVE — AB
Chlamydia: NEGATIVE
Comment: NEGATIVE
Comment: NEGATIVE
Comment: NEGATIVE
Comment: NEGATIVE
Comment: NEGATIVE
Comment: NORMAL
Neisseria Gonorrhea: NEGATIVE
Trichomonas: NEGATIVE

## 2022-08-29 MED ORDER — CLOTRIMAZOLE 3 2 % VA CREA: 1.0000 | TOPICAL_CREAM | Freq: Every day | VAGINAL | 0 refills | Status: AC

## 2022-08-30 ENCOUNTER — Telehealth: Payer: Self-pay | Admitting: *Deleted

## 2022-08-30 MED ORDER — FAMOTIDINE 20 MG PO TABS: 20.0000 mg | ORAL_TABLET | Freq: Two times a day (BID) | ORAL | 3 refills | Status: DC

## 2022-08-30 NOTE — Telephone Encounter (Addendum)
-----   Message from Myrtie Hawk, DO sent at 08/29/2022 10:42 PM EST ----- Hello,  Please call pt to let her know that she has a yeast infection. I have sent the medication to your pharmacy. Please take them as prescribed. Let us know if you have any questions.   Dr. Lanae Crumbly  11/22 1655  Called pt w/[employer] interpreter [phone]. She was notified of test results, prescription sent to pharmacy and dosing instructions. Pt requested Rx for heartburn. Rx for Pepcid 20 mg BID sent in per standing order. Pt voiced understanding.

## 2022-09-01 LAB — PANORAMA PRENATAL TEST FULL PANEL:PANORAMA TEST PLUS 5 ADDITIONAL MICRODELETIONS: FETAL FRACTION: 14.2

## 2022-09-04 LAB — HORIZON CUSTOM: REPORT SUMMARY: NEGATIVE

## 2022-09-08 ENCOUNTER — Other Ambulatory Visit: Payer: Self-pay | Admitting: Family Medicine

## 2022-09-08 DIAGNOSIS — O219 Vomiting of pregnancy, unspecified: Secondary | ICD-10-CM

## 2022-09-14 ENCOUNTER — Ambulatory Visit (HOSPITAL_COMMUNITY): Payer: Medicaid Other | Attending: Internal Medicine

## 2022-09-14 DIAGNOSIS — R0600 Dyspnea, unspecified: Secondary | ICD-10-CM | POA: Diagnosis present

## 2022-09-14 LAB — ECHOCARDIOGRAM COMPLETE
Area-P 1/2: 3.81 cm2
S' Lateral: 3 cm

## 2022-09-25 ENCOUNTER — Ambulatory Visit (INDEPENDENT_AMBULATORY_CARE_PROVIDER_SITE_OTHER): Payer: Medicaid Other | Admitting: Advanced Practice Midwife

## 2022-09-25 ENCOUNTER — Encounter: Payer: Self-pay | Admitting: *Deleted

## 2022-09-25 VITALS — BP 101/57 | HR 73 | Wt 150.1 lb

## 2022-09-25 DIAGNOSIS — Z789 Other specified health status: Secondary | ICD-10-CM

## 2022-09-25 DIAGNOSIS — O0990 Supervision of high risk pregnancy, unspecified, unspecified trimester: Secondary | ICD-10-CM

## 2022-09-25 DIAGNOSIS — Z98891 History of uterine scar from previous surgery: Secondary | ICD-10-CM

## 2022-09-25 DIAGNOSIS — O09522 Supervision of elderly multigravida, second trimester: Secondary | ICD-10-CM

## 2022-09-25 DIAGNOSIS — R7303 Prediabetes: Secondary | ICD-10-CM

## 2022-09-25 DIAGNOSIS — K5900 Constipation, unspecified: Secondary | ICD-10-CM

## 2022-09-25 DIAGNOSIS — Z3A17 17 weeks gestation of pregnancy: Secondary | ICD-10-CM

## 2022-09-25 DIAGNOSIS — O99611 Diseases of the digestive system complicating pregnancy, first trimester: Secondary | ICD-10-CM

## 2022-09-25 DIAGNOSIS — O219 Vomiting of pregnancy, unspecified: Secondary | ICD-10-CM

## 2022-09-25 MED ORDER — ONDANSETRON HCL 4 MG PO TABS: 4.0000 mg | ORAL_TABLET | Freq: Four times a day (QID) | ORAL | 6 refills | Status: DC | PRN

## 2022-09-25 MED ORDER — CONCEPT OB 130-92.4-1 MG PO CAPS: 1.0000 | ORAL_CAPSULE | Freq: Every day | ORAL | 12 refills | Status: DC

## 2022-09-25 NOTE — Progress Notes (Signed)
   PRENATAL VISIT NOTE  Subjective:  [address] Lauren Ellis is a 41 y.o. [id number] at [redacted]w[redacted]d being seen today for ongoing prenatal care.  She is currently monitored for the following issues for this high-risk pregnancy and has Left breast mass; Constipation; Low back pain; Palpitations; Dysuria; SOB (shortness of breath); Heart burn; Skin lesion of scalp; Scoliosis of lumbar spine; Anxiety; AMA (advanced maternal age) multigravida 35+; Supervision of high risk pregnancy, antepartum; History of C-section; Language barrier; and Prediabetes on their problem list.  Patient reports no complaints.  Contractions: Not present. Vag. Bleeding: None.  Movement: Present. Denies leaking of fluid.   The following portions of the patient's history were reviewed and updated as appropriate: allergies, current medications, past family history, past medical history, past social history, past surgical history and problem list.   Objective:   Vitals:   09/25/22 0957  BP: (!) 101/57  Pulse: 73  Weight: 150 lb 1.6 oz (68.1 kg)    Fetal Status: Fetal Heart Rate (bpm): 146 Fundal Height: 18 cm Movement: Present     General:  Alert, oriented and cooperative. Patient is in no acute distress.  Skin: Skin is warm and dry. No rash noted.   Cardiovascular: Normal heart rate noted  Respiratory: Normal respiratory effort, no problems with respiration noted  Abdomen: Soft, gravid, appropriate for gestational age.  Pain/Pressure: Absent     Pelvic: Cervical exam deferred        Extremities: Normal range of motion.  Edema: None  Mental Status: Normal mood and affect. Normal behavior. Normal judgment and thought content.   Assessment and Plan:  Pregnancy: G5P4004 at [redacted]w[redacted]d 1. Supervision of high risk pregnancy, antepartum - Declined AFP - Prenat w/o A Vit-FeFum-FePo-FA (CONCEPT OB) 130-92.4-1 MG CAPS; Take 1 tablet by mouth daily.  Dispense: 30 capsule; Refill: 12   2. Prediabetes - Nml A1C  3. Language  barrier - Arabic interpreter used  4. History of C-section - x 1, VBAC x 4  5. Multigravida of advanced maternal age in second trimester - Antenatal testing per MFM  6. [redacted] weeks gestation of pregnancy   7. Nausea and vomiting during pregnancy  - ondansetron (ZOFRAN) 4 MG tablet; Take 1 tablet (4 mg total) by mouth every 6 (six) hours as needed for nausea or vomiting.  Dispense: 30 tablet; Refill: 6   Preterm labor symptoms and general obstetric precautions including but not limited to vaginal bleeding, contractions, leaking of fluid and fetal movement were reviewed in detail with the patient. Please refer to After Visit Summary for other counseling recommendations.   Return in about 4 weeks (around 10/23/2022) for ROB.  Future Appointments  Date Time Provider Department Center  10/10/2022 10:15 AM [facility] NURSE [facility]  10/10/2022 10:30 AM [facility] US3 [facility]  10/24/2022 11:15 AM Armando Reichert, CNM [facility]    Dorathy Kinsman, CNM

## 2022-09-25 NOTE — Patient Instructions (Signed)
AREA PEDIATRIC/FAMILY PRACTICE PHYSICIANS  [place] ([address]) [facility] Chambliss, MD; Eniola, MD; Hale, MD; Hensel, MD; McDiarmid, MD; McIntyer, MD; Neal, MD; Walden, MD [address] [place], [place] [address] [phone] Mon-Fri 8:30-12:30, 1:30-5:00 Providers come to see babies at [facility] Accepting Medicaid [facility] providers who accept newborns: Koirala, MD; Morrow, MD; Wolters, MD [address], [place], [place] [address] [phone] Mon-Fri 8:00-5:30 Babies seen by providers at [facility] Does NOT accept Medicaid Please call early in hospitalization for appointment (limited availability)  [facility] Mulberry, MD [address] [place], [place] [address] [phone] Mon, Tue, Thur, Fri 8:30-5:00, Wed 10:00-7:00 (closed 1-2pm) Babies seen by [facility] providers Accepting Medicaid Rubin - Pediatrician Rubin, MD [address], [place], [place] [address] [phone] Mon-Fri 8:30-5:00, Sat 8:30-12:00 Provider comes to see babies at [facility] Accepting Medicaid Must have been referred from current patients or contacted office prior to delivery [facility] ([facility]) Brown, MD; Chandler, MD; Ettefagh, MD; Grant, MD; Lester, MD; McCormick, MD; McQueen, MD; Prose, MD; Simha, MD; Stanley, MD; Stryffeler, NP; Tebben, NP [address], [place], [place] [address] [phone] Mon, Tue, Thur, Fri 8:30-5:30, Wed 9:30-5:30, Sat 8:30-12:30 Babies seen by [facility] providers Accepting Medicaid Only accepting infants of first-time parents or siblings of current patients Hospital discharge coordinator will make follow-up appointment Jack Amos [address], [place], [place]  [address] [phone]   Fax - [phone] [facility] [address], [place], [place]  [address] Phone - [phone]   Fax - [phone] Shilpa Gosrani [address], [place], [place]  [address] [phone]  [place] ([address]) [facility] Bates, MD; Brassfield, MD; Cooper, Cox, MD; MD; Davis, MD; Dovico, MD; Ettefaugh, MD; Little, MD; Lowe, MD; Keiffer, MD; Melvin, MD; Sumner, MD; Williams, MD [address], [place], [place] [address] [phone] Mon-Fri 8:30-5:00 (extended evenings Mon-Thur as needed), Sat-Sun 10:00-1:00 Providers come to see babies at [facility] Accepting Medicaid for families of first-time babies and families with all children in the household age 3 and under. Must register with office prior to making appointment (M-F only). [facility] Henson, NP; Knapp, MD; Lalonde, MD; Tysinger, PA [address] [place], [place] [address] [phone] Mon-Fri 8:00-5:00 Babies seen by providers at [facility] Does NOT accept Medicaid/Commercial Insurance Only [facility] ([facility])  Artis, MD; Barnes, MD; Bratton, MD; Coccaro, MD; [place] Gardner, MD; Kramer, MD; Marshall, MD; Netherton, MD; Poleto, MD; Skinner, MD [address] [place], [place] [address] [phone] Mon-Fri 8:30-5:30, Sat (Oct.-Mar.) 9:00-1:00 Babies seen by providers at [facility] Accepting Medicaid  [place] ([address]) [facility] Reid, MD; Warner, MD [address], [place], [place] [address] [phone] Mon-Fri 8:30-5:00, Sat 8:30-12:00 Providers come to see babies at [facility] Does NOT accept Medicaid [facility] Becker, PA; Hagler, MD; Scifres, PA; Sun, MD; Swayne, MD [address], [place], [place] [address] [phone] Mon-Fri 8:00-5:00 Babies seen by providers at [facility] Does NOT accept Medicaid Only accepting babies of parents who  are patients Please call early in hospitalization for appointment (limited availability) [facility] Clark, MD; Frye, MD; Kelleher, MD; Mack, NP; Miller, MD; O'Keller, MD; Patterson, NP; Pudlo, MD; Puzio, MD; Thomas, MD; Tucker, MD; Twiselton, MD [address], [place], [place] [address] [phone] Mon-Fri 8:00-5:00, Sat 9:00-12:00 Providers come to see babies at [facility] Does NOT accept Medicaid  [place] ([address]) [facility] providers accepting new patients: Brake, NP; Wharton, PA [address], [place], [place] [address] [phone] Mon-Fri 8:00-5:00 Babies seen by providers at [facility] Does NOT accept Medicaid Only accepting babies of parents who are patients Please call early in hospitalization for appointment (limited availability) [facility] Gay, MD; Quinlan, MD [address] [place], [place] [address] [phone] (press 1 to schedule appointment) Mon-Fri 8:00-5:00 Providers come to see babies at [facility] Does NOT accept Medicaid [facility] Mazer, MD [address] [place], [place] [address] [phone] Mon-Fri 8:30-5:00 (lunch 12:30-1:00), extended hours by appointment only Wed 5:00-6:30 Babies seen by [facility] providers Accepting Medicaid [facility] Banks, MD; Jordan, MD; Koberlein, MD [address], [place], [place] [address] [phone] Mon-Fri 8:00-5:00 Babies seen by [facility] providers Does NOT accept Medicaid [facility] Parker, MD; Hunter, MD; Wallace, DO [address] [place], [place] [address] [phone] Mon-Fri 8:00-5:00 Babies seen by [facility] providers Does NOT accept Medicaid [facility] Brandon, PA; Brecken, PA; Christy, NP; Dees, MD; DeClaire, MD; DeWeese, MD; Hansen, NP; Mills, NP; Parrish, NP; Smoot, NP; Summer, MD; Vapne,  MD [address] [place], [place] [address] [phone] Mon-Fri 8:30-5:00, Sat 10:00-1:00 Providers come to see babies at [facility] Does NOT accept Medicaid Free prenatal information session Tuesdays at 4:45pm [facility] Bouska, MD; Gordon, PA; Jeffery, PA; Weber, PA [address] [place] [address] [phone] Mon-Fri 7:30-5:30 Babies seen by [facility] providers [facility] [address], [place], [place]  [address] [phone]   Fax - [phone]  [place] ([address] & [address]) [facility] [place] [address] [place], [place] [address] [phone] Mon-Thur 8:00-6:00 Providers come to see babies at [facility] Accepting Medicaid [facility] Anderson, NP; Badger, MD; Beal, PA; Spencer, PA [address] [place], [place] [address] [phone] Mon-Thur 7:30-7:30, Fri 7:30-4:30 Babies seen by [facility] providers Accepting Medicaid [facility] Agbuya, MD; Klett, NP; Romgoolam, MD [address], [place], [place] [address] [phone] Mon-Fri 8:30-5:00, Sat 8:30-12:00 Providers come to see babies at [facility] Accepting Medicaid Must have "Meet & Greet" appointment at office prior to delivery [facility] McCord, MD; Wallace, MD; Wood, MD [address], [place], [place] [address] [phone] Mon-Wed 8:00-6:00, Thur-Fri 8:00-5:00, Sat 9:00-12:00 Providers come to see babies at [facility] Does NOT accept Medicaid Only accepting siblings of current patients [facility]  [address], [place], [place]  [address] [phone]   Fax - [phone] [facility] [address], [place], [place]  [address] [phone]   Fax -  [phone]  [place] ([address] & [address]) [facility] Cirigliano, DO; Matthews, DO [address] [place], [place] [address] [phone] Mon-Fri 7:00-5:00 Babies seen by [facility] providers Does NOT accept Medicaid [facility] Briscoe, MD; Howley, PA; Moreira, PA [address], [place],  [address] [phone] Mon-Fri 8:00-5:00 Babies seen by [facility] providers Accepting Medicaid [facility] Boyd, MD; Church, PA; Jones, NP; Osborn, PA [address], ,  [address] [phone] Mon-Fri 8:00-5:00 Babies seen by providers at [facility] Accepting Medicaid  [place] ([address]) [facility] [place] Wendling, DO [address] [place], [place] [address] [phone] Mon-Fri 8:00-5:00 Babies seen by [facility] providers Does NOT accept Medicaid Limited availability, please call early in hospitalization to schedule follow-up [facility] Calderon, PA; Cummings, MD; Dillard, MD; Martin, PA; Olson, MD; VanDeven, PA [address], [place], [place] [address] [phone] Mon-Fri 8:30-5:00, Sat 9:00-12:00 Babies seen by providers at [facility] Accepting Medicaid Please register online then schedule online or call office [email or web address] Hunter, NP; Kumar, MD; Martin Rogers, PA [address], [place], [place] [address] [phone] Mon-Fri 8:00-5:00 Babies seen by providers at [facility] Accepting Medicaid [facility] ([facility]) [place], MD; Kristi Fleenor, NP; West, MD [address], [place], [place] [address] [phone] Mon-Fri 8:00-5:30, Sat&Sun by appointment (phones open at  8:30) Babies seen by [facility] providers Accepting Medicaid Must be a first-time baby or sibling of current patient [facility] [place]  [address], [place], [place]  [address] [phone]   Fax - [phone]  [place] ([address]) [facility] Brown, PA; Cowen, PA; Rice, MD; Helton, PA; Spry, MD [address] [place], [place] [address] [phone] Mon-Thur 8:00-7:00, Fri 8:00-5:00, Sat 8:00-12:00, Sun 9:00-12:00 Babies seen by [facility] providers Accepting Medicaid [facility] Coe-Goins, MD; Marshall, MD; Pierre-Louis, MD [address] [place] [address], [place], [place] [address] [phone] Mon-Thur 8:00-5:00 Babies seen by providers at [facility] Accepting Medicaid [facility] Bratton, MD; Coe-Goins, MD; Hayes, MD; Lewis, MD; List, MD; Lott, MD; Marshall, MD; Moran, MD; O'Neal, MD; Pierre-Louis, MD; Pitonzo, MD; Scholer, MD; Spangle, MD [address] [place], [place] [address] [phone] Mon-Fri 8:00-5:30, Sat (Oct.-Mar.) 9:00-1:00 Babies seen by providers at [facility] Accepting Medicaid Must fill out new patient packet, available online at [email or web address] [facility] ([facility]) Friddle, NP; Harris, NP; Kelly, NP; Logan, MD; Melvin, PA; Poth, MD; Ramadoss, MD; Stanton, NP [address], [place], [place] [address] [phone] Mon-Thur 8:00-5:30, Fri 8:00-5:00 Babies seen by providers at [facility] Accepting Medicaid  [place] ([address]) [facility] Dixon, PA; Montandon, MD; Pickard, MD; Tapia, PA [address], [place], [place] [address] [phone] Mon-Fri 8:00-5:00 Babies seen by providers at [facility] Accepting Medicaid   [place] ([address]) [facility] [place] Masneri, DO; Meyers, MD; Nelson, PA [address], [place], [place] [address] [phone] Mon-Fri 8:00-5:00 Babies seen by providers at [facility] Does NOT accept Medicaid Limited appointment availability, please call early in hospitalization  [facility] [place] Kunedd, DO; McGowen, MD [address], [place], [place] [address] [phone] Mon-Fri 8:00-5:00 Babies seen by [facility] providers Does NOT accept Medicaid [facility] [place] Cameron, MD; MacDonald, MD; Michaels, PA; Nayak, MD [address] [place] [address], [place], [place] [address] [phone] Mon-Fri 8:00-5:00 After hours clinic ([address] [place], [place] [address]) [phone] Mon-Fri 5:00-8:00, Sat 12:00-6:00, Sun 10:00-4:00 Babies seen by [facility] providers Accepting Medicaid [facility] [place] [address], [place], [place]  [address] [phone]   Fax - [phone]  [place] ([address]) [facility] [place] [facility] Andy, MD [address], [place], [place] [address] [phone] Mon-Fri 8:00-5:00 Babies seen by [facility] providers Does NOT accept Medicaid [facility] [place] ([facility] [place]) Eksir, MD [address], [place], [place] [address] [phone] Mon-Thur 8:00-7:00, Fri 8:00-5:00, Sat 8:00-12:00 Babies seen by providers at [facility] Accepting Medicaid - but does not have vaccinations in office (must be received elsewhere) Limited availability, please call early in hospitalization  [place] ([address]) [facility]  Charlene Flemming, MD [address], [place] [address] [phone]  Fax [phone]  [facility]  Kimberly Newton, MD, Annamarie Streilein, PA, Carla Hampton, PA [address] [place], [place]  [address] [phone] [facility]  [address], [place], [place] [address] [phone] [address], [place], [place] [address] [phone] (West Office)  [facility] [address], [place], [place] [address] [phone] [facility] [address], [place], [place] [address] [phone] [facility] [address], [place], [place] [address] [phone] [facility] [address], [place], [place] [address] [phone] [facility] [address], [place], [place] [address] [phone] [facility] [address], [place], [place] [address] [phone] [facility]  [address], [place], [place] [address] [phone] Dr[facility] [address] [place], [place], [place] [address] [phone] [facility] [address], [place], [place] [address] [phone] [facility] [address], [place],  [address] [phone]  

## 2022-10-04 ENCOUNTER — Other Ambulatory Visit: Payer: Self-pay

## 2022-10-04 MED ORDER — PREPLUS 27-1 MG PO TABS: 1.0000 | ORAL_TABLET | Freq: Every day | ORAL | 6 refills | Status: DC

## 2022-10-09 NOTE — L&D Delivery Note (Signed)
OB/GYN Faculty Practice Delivery Note  [facility] Lauren Ellis is a 42 y.o. [id number] s/p VBAC at [redacted]w[redacted]d. She was admitted for IOL 2/2 A2GDM.   ROM: 1h 70m with clear fluid GBS Status:  Negative/-- (05/08 0930) Maximum Maternal Temperature: 98.3  Labor Progress: Initial SVE: 4/80/-1. She then progressed to complete.   Delivery Date/Time: 02/26/2023 @1513  Delivery: Called to room and patient was complete and pushing. Head delivered LOA. No nuchal cord present. There was a right compound hand. Shoulder and body delivered in usual fashion. Infant with spontaneous cry, placed on mother's abdomen, dried and stimulated. Cord clamped x 2 after 1-minute delay, and cut by this provider. Cord blood drawn. Placenta delivered spontaneously with gentle cord traction. Fundus firm with massage and Pitocin. Labia, perineum, vagina, and cervix inspected. There was left clitoral hood laceration that was repaired with interrupted sutures to assist with approximation.   Baby Weight: pending  Placenta: 3 vessel, intact. Sent to L&D Complications: None Lacerations: Left clitoral hood; repaired with 4.0 monocryl  EBL: 121 mL Analgesia: Epidural   Infant:  APGAR (1 MIN):  8 APGAR (5 MINS):  9  Keshayla Schrum Autry-Lott, DO OB Fellow, Faculty [employer] [employer] 02/26/2023, 5:00 PM

## 2022-10-10 ENCOUNTER — Ambulatory Visit: Payer: Medicaid Other | Attending: Family Medicine

## 2022-10-10 ENCOUNTER — Other Ambulatory Visit: Payer: Self-pay | Admitting: *Deleted

## 2022-10-10 ENCOUNTER — Ambulatory Visit: Payer: Medicaid Other | Admitting: *Deleted

## 2022-10-10 VITALS — BP 103/53 | HR 67

## 2022-10-10 DIAGNOSIS — O0990 Supervision of high risk pregnancy, unspecified, unspecified trimester: Secondary | ICD-10-CM | POA: Diagnosis present

## 2022-10-10 DIAGNOSIS — Z98891 History of uterine scar from previous surgery: Secondary | ICD-10-CM

## 2022-10-10 DIAGNOSIS — O34219 Maternal care for unspecified type scar from previous cesarean delivery: Secondary | ICD-10-CM

## 2022-10-10 DIAGNOSIS — Z3A18 18 weeks gestation of pregnancy: Secondary | ICD-10-CM

## 2022-10-10 DIAGNOSIS — Z363 Encounter for antenatal screening for malformations: Secondary | ICD-10-CM | POA: Diagnosis not present

## 2022-10-10 DIAGNOSIS — O09522 Supervision of elderly multigravida, second trimester: Secondary | ICD-10-CM

## 2022-10-10 DIAGNOSIS — Z789 Other specified health status: Secondary | ICD-10-CM | POA: Insufficient documentation

## 2022-10-10 DIAGNOSIS — O09292 Supervision of pregnancy with other poor reproductive or obstetric history, second trimester: Secondary | ICD-10-CM

## 2022-10-10 DIAGNOSIS — N6320 Unspecified lump in the left breast, unspecified quadrant: Secondary | ICD-10-CM

## 2022-10-24 ENCOUNTER — Ambulatory Visit (INDEPENDENT_AMBULATORY_CARE_PROVIDER_SITE_OTHER): Payer: Medicaid Other | Admitting: Advanced Practice Midwife

## 2022-10-24 VITALS — BP 111/48 | HR 67 | Wt 154.0 lb

## 2022-10-24 DIAGNOSIS — Z3A20 20 weeks gestation of pregnancy: Secondary | ICD-10-CM

## 2022-10-24 DIAGNOSIS — O0990 Supervision of high risk pregnancy, unspecified, unspecified trimester: Secondary | ICD-10-CM

## 2022-10-24 NOTE — Progress Notes (Signed)
   PRENATAL VISIT NOTE  Subjective:  [address] Lauren Ellis is a 42 y.o. [phone] at [redacted]w[redacted]d being seen today for ongoing prenatal care.  She is currently monitored for the following issues for this high-risk pregnancy and has Left breast mass; Constipation; Low back pain; Palpitations; Dysuria; SOB (shortness of breath); Heart burn; Skin lesion of scalp; Scoliosis of lumbar spine; Anxiety; AMA (advanced maternal age) multigravida 33+; Supervision of high risk pregnancy, antepartum; History of C-section; Language barrier; and Prediabetes on their problem list.  Patient reports no complaints.  Contractions: Not present. Vag. Bleeding: None.  Movement: Present. Denies leaking of fluid.   The following portions of the patient's history were reviewed and updated as appropriate: allergies, current medications, past family history, past medical history, past social history, past surgical history and problem list.   Objective:   Vitals:   10/24/22 1111  BP: (!) 111/48  Pulse: 67  Weight: 154 lb (69.9 kg)    Fetal Status: Fetal Heart Rate (bpm): 145 Fundal Height: 20 cm Movement: Present     General:  Alert, oriented and cooperative. Patient is in no acute distress.  Skin: Skin is warm and dry. No rash noted.   Cardiovascular: Normal heart rate noted  Respiratory: Normal respiratory effort, no problems with respiration noted  Abdomen: Soft, gravid, appropriate for gestational age.  Pain/Pressure: Absent     Pelvic: Cervical exam deferred        Extremities: Normal range of motion.  Edema: None  Mental Status: Normal mood and affect. Normal behavior. Normal judgment and thought content.   Assessment and Plan:  Pregnancy: [id number] at [redacted]w[redacted]d 1. Supervision of high risk pregnancy, antepartum - patient has had 1 c-section > followed by 3 VBACs  - AMA - will start testing at 36 weeks  - EDC changed per MFM to 03/12/2023   2. [redacted] weeks gestation of pregnancy - routine care  Preterm labor  symptoms and general obstetric precautions including but not limited to vaginal bleeding, contractions, leaking of fluid and fetal movement were reviewed in detail with the patient. Please refer to After Visit Summary for other counseling recommendations.   Return in about 4 weeks (around 11/21/2022).  Future Appointments  Date Time Provider [place]  11/07/2022 10:30 AM [facility] NURSE [facility]  11/07/2022 10:45 AM [facility] [place] DNP, CNM  10/24/22  11:21 AM

## 2022-10-24 NOTE — Patient Instructions (Signed)
?????? ????? ??? ????? Contraception Choices ????? ??? ?????? ?????? ????? ????????? ????????? ??? ??????? ?? ??????? ???? ???? ???? ?????. ??????? ?????????  ????? ??? ????? ???? ??? ????? ????? ???????? ???? ????? ??? ????? ???? ?????. ???? ?????? ?????? ?? ?????? ?????? (IUD). ???? ????? ??????? ?????? ??? ?????? ?? ???? ??????. ??? ??? ?????? ??????? ??? 3 ?????. ??? ??????????? ??? ??? ?????????? ??? ?? ??? ??????????? ???? ??? ?????? ?? ????? ???????????. ??????? ????? ??????? ?????? ??? ????? ?? 3 ????. ???? ????????? ???????? ???? ????????? ???????? ?? ???? ????? ??? ??????? ???? ?????. ??? ?? ????? ??? ????? ?? ?????? ????? ?? ???? ?? ????? ???? ?? ???. ???? ???? ???? ???????? ??? ??????? ???? ?????. ???? ????????? ???????? ????? ???? ????????? ???????? ??? ??????? ???? ?????. ????? ??? ?????? ??? ?????? ???? ??????? ??? ????? ????????? ???? ????? ?????? ???????? ?? ??????? ??????. ???? ???? ???? ???????? ??? ??????? ???? ?????. ?????? ???????? ????? ?????? ?????? ??? ??????? ???? ?????. ?????? ?? ?????? ???? ????? ?????? ?? ???? ??????? ?? ??????? ??????. ??? ???? ????? ??????? ????? ?????. ???? ???? ???? ???????? ??? ??????? ???? ?????. ????? ??? ????? ?? ???? ??????? ????? ??? ????? ?? ???? ??????? ???? ???? ????? ??? ?????? ????? ??? ?????. ????? ?? ??? ???? ??? ????? ???? ????? ??? 5 ???? ??? ?????? ?????. ????? ???? ???? ??? ?? ????? ?? ???? ??? ??? ?????? ?????. ????? ???? ????? ??? ????? ??????? ??? ???? ????. ?? ????? ?? ?????? ??? ??????? ???? ???? ?? ????? ????????? ????????. ??????? ???????  ?????? ?????? ?????? ?????? ??? ???? ???? ??????? ??? ?????? ?? ????? ?????? ?????. ???? ?????? ?????? ????????? ??????? ?? ??????? ??? ??? ??????. ????? ???????? ?? ???? ???? ????????? ??????? (????? ????????? ???????) ?????? ????????. ???? ?????? ?? ?????? ??? ???????? ??? ?????. ?????? ??????? ?????? ??????? ??? ????? ??? ???? ?? ?????? ??? ?????? ?????. ???? ?????? ?????? ?????????  ??????? ?? ?????? ??? ??? ??????. ???? ?????? ?? ?????? ??? ???????? ??? ?????. ??????? ???????? ???????????? ??????? ???????? ???????????? ?? ???? ???? ??? ??? ???. ????? ?????? ?? ?????? ??? ?????? ?????? ????? ??? ??? ?? ???? ????????? ???????. ???? ???????? ???????? ???????????? ????????? ??????? ?? ???? ?????? ??????? ????? ????????? ???????. ??? ??? ??????? ???????? ???????????? ?? ?????? ???? ?? 6-8 ????? ??? ?????? ????? ??????? ?? ???? 24 ????. ????? ???? ??????? ?????? ??? ??????? ???????? ???????????? ???????. ??? ??????? ??????? ???????? ???????????? ?? ??? ?? ??? ??? ?????? ??? ??????? ???? ????? ???? ?? 15 ????? (6.8 ???) ?? ????? ???? ?????? ?????? ?????. ???? ??? ????? ???? ??? ????? ??? ?????? ?? ????? ?? ???????? ???? ????? ??? ?????. ????? ?????? ?? ?????? ??? ?????? ?????? ?? ???? ????????? ???????. ????? ??? ????????? ??????? ?? ???? ?????. ??? ??? ???? ??? ????? ???? ?? 6-8 ????? ??? ?????? ????? ??????? ?? ???? 48 ????. ?????? ?? ????? ???? ??????? ?????? ??? ???? ??? ????? ???????. ???? ??? ???????? ?? ?????. ??????? ??????? ???? ?????? ?? ???? ?????? ??????? ??? ???? ????????? ???????. ????? ??????? ????????? ??????? ?? ???? ?????? ????? ???? ????????? ??????? ?????? ??????. ????? ???????? ?? ??????? ?? ?????? ?? ??????. ??? ?????? ??? ?????? ?????? ?????? ???? 6 ????? ??? ????? ??? ?????? ?????? ?? ???? ?????? ??????? ?????? ???? 30 ????. ?????? ????????? ??????? ?????? ????????? ??????? ????? ???????? ???? ????????? ??????? ?? ?????? ?? ?????? ??? ??? ????? ???? ?????. ????? ?? ???? ??? ??? ???? ?? ???? ?? ??????????? ?? ???? ?? ???. ??? ????? ???? ????????? ??????? ?? ?????? ????? ??? 10-15 ????? ??? ????? ??? ?????? ????? ?????? ????? ??? ????. ???? ????? ??? ??????? ?? ?? ??? ????? ???? ?????. ?????? ????????? ??????? ?? ????? ???? ????. ????? ??? ????? ???? ????? ???? ???? ????? (?????? ??????  IUD) ?????? ?????? ????? ??? ??? ??? T ???? ???? ?? ??? ??????. ???? ?????  ???????:  ?????? ?????? ????????.????? ??? ????? ??? ??????????? ???? ??? ????? ?? ????? ???????????. ???? ?? ???? ??? ????? ?? ????? ???? ?????? ?? 3-5 ?????.  ?????? ?????? ???????.???? ????? ???? ??????? ???? ?????. ????? ?? ??? ?? ????? ???? 10 ?????. ??????? ??????? ???? ????? ????? ?????? ?????? ???? ??? ??????? ??? ????? ????? ??? ?????? ???????? ?? ??????? ?? ????? ???? ??????? ?? ???????? ??? ?????. ??????? ?????? ????? ???? ??? ??????? ??? ???? ????? ????? ?? ?? ?? ????? ?????. ????? ??? ??????? ?? ???? ???? ???? ?? ????? ????? ??? ???????? ????????? ??? ?? ????? ????????? ??????? ?? ?????? ??? ???????. ??????? ??????? ????? 3 ???? ????? ??????? ??????. ??? ??? ??????? ??? ??? ?? ????? ????????? ???????? ???? ?????? ??????? ????????. ????? ?????? ??? ????? ???? ???????? ???? ???? ????????? ??????? (??? ?????? ???????). ???? ????? ??????? ??? ??????? (?????) ??? ?????? ???????. ?????? ??????? ????? ???? ?? ????? ????????? ???????? ???? 3 ???? ??? ??? ???????. ????? ??????? ??????? ????? ?????? ??????? ?? ??? ???????? ????? ??????? ?? ?????? ????? ?? ?????? ???? ???? ?? ???? ???? ?????? ??????. ????? ??????? ???? ??? ?????? ??? ?? ???? ???? ?????? ?????? ???? ???? ?? ???? ???? ???? ????????? ?? ?????? ?????? ?? ??? ??????. ????? ??????? ?? ??? ???????? ????? ??????? ?????? ????? ?? ????? ???????. ????? ???????? ???????? ????? ??? ??????? ??? ?????? ????? ?? ????? ???????. ???? ?? ????? ?????? ?? ??????? ??????? ???????? ?? ???? ??????? ??? ????? ???? ??? ?????. ????? ??? ??????? ?? ??? ???????? ???? ??????? ?????? ????? ??? ?? ??? ???????. ????? ?????? ??? ?????? ?? ?????????:  ????? ?????? ??????? ???????? (Centers for Disease Control and Prevention):? [email or web address] ????  ????? ??? ?????? ?????? ????? ????????? ????????? ??? ??????? ?? ??????? ???? ???? ???? ?????.  ???? ??? ??? ????? ????????? ??? ????? ??? ????? ?????? ??????? ?????? ?????? ?????? ?????? ??? ????? ???????.  ???? ??  ???? ??? ??? ????? ??? ???????? ???????? ??????? ?????? ???????? ???????? ???????? ????????????? ?????? ??? ?????? ???????? ? ??????? ????????? ???????.  ???? ????? ?? ????? ?????? ?????? (??????? ???? ?????). ???? ??? ?????? ?????? ?? ??? ?????? ???? ????? ??? ??? 3-5 ?????.  ???? ??????? ??????? ?????? ?????? ???????. ????? ????? ???????? ?????? ?????? ??? ?????? ????? ?? ?????? ???? ?? ???? ???? ?????? ??????. ??? ????? ?? ??? ????????? ?? ???? ?????? ????????? ???? ?????? ???? ??????? ??????. ???? ?? ?????? ??? ????? ???? ?? ???? ?? ???? ??????? ??????.? Document Revised: 03/31/2020 Document Reviewed: 03/31/2020 Elsevier Patient Education  [place].

## 2022-11-07 ENCOUNTER — Ambulatory Visit: Payer: Medicaid Other | Admitting: *Deleted

## 2022-11-07 ENCOUNTER — Other Ambulatory Visit: Payer: Self-pay | Admitting: *Deleted

## 2022-11-07 ENCOUNTER — Ambulatory Visit: Payer: Medicaid Other | Attending: Obstetrics

## 2022-11-07 ENCOUNTER — Encounter: Payer: Self-pay | Admitting: *Deleted

## 2022-11-07 VITALS — BP 100/67 | HR 62

## 2022-11-07 DIAGNOSIS — Z789 Other specified health status: Secondary | ICD-10-CM | POA: Insufficient documentation

## 2022-11-07 DIAGNOSIS — O99891 Other specified diseases and conditions complicating pregnancy: Secondary | ICD-10-CM

## 2022-11-07 DIAGNOSIS — N6320 Unspecified lump in the left breast, unspecified quadrant: Secondary | ICD-10-CM | POA: Diagnosis present

## 2022-11-07 DIAGNOSIS — O34219 Maternal care for unspecified type scar from previous cesarean delivery: Secondary | ICD-10-CM | POA: Insufficient documentation

## 2022-11-07 DIAGNOSIS — N63 Unspecified lump in unspecified breast: Secondary | ICD-10-CM

## 2022-11-07 DIAGNOSIS — O09522 Supervision of elderly multigravida, second trimester: Secondary | ICD-10-CM | POA: Diagnosis not present

## 2022-11-07 DIAGNOSIS — Z3A22 22 weeks gestation of pregnancy: Secondary | ICD-10-CM

## 2022-11-07 DIAGNOSIS — Z363 Encounter for antenatal screening for malformations: Secondary | ICD-10-CM | POA: Diagnosis not present

## 2022-11-07 DIAGNOSIS — O0990 Supervision of high risk pregnancy, unspecified, unspecified trimester: Secondary | ICD-10-CM | POA: Insufficient documentation

## 2022-11-07 DIAGNOSIS — Z98891 History of uterine scar from previous surgery: Secondary | ICD-10-CM

## 2022-11-07 DIAGNOSIS — O09523 Supervision of elderly multigravida, third trimester: Secondary | ICD-10-CM

## 2022-11-09 ENCOUNTER — Other Ambulatory Visit: Payer: Self-pay | Admitting: *Deleted

## 2022-11-09 DIAGNOSIS — O09523 Supervision of elderly multigravida, third trimester: Secondary | ICD-10-CM

## 2022-11-21 ENCOUNTER — Ambulatory Visit (INDEPENDENT_AMBULATORY_CARE_PROVIDER_SITE_OTHER): Payer: Medicaid Other | Admitting: Family Medicine

## 2022-11-21 ENCOUNTER — Encounter: Payer: Self-pay | Admitting: Advanced Practice Midwife

## 2022-11-21 VITALS — BP 117/55 | HR 70 | Wt 168.1 lb

## 2022-11-21 DIAGNOSIS — O0990 Supervision of high risk pregnancy, unspecified, unspecified trimester: Secondary | ICD-10-CM

## 2022-11-21 DIAGNOSIS — K219 Gastro-esophageal reflux disease without esophagitis: Secondary | ICD-10-CM

## 2022-11-21 DIAGNOSIS — Z98891 History of uterine scar from previous surgery: Secondary | ICD-10-CM

## 2022-11-21 DIAGNOSIS — Z3A24 24 weeks gestation of pregnancy: Secondary | ICD-10-CM

## 2022-11-21 DIAGNOSIS — O09522 Supervision of elderly multigravida, second trimester: Secondary | ICD-10-CM

## 2022-11-21 DIAGNOSIS — Z789 Other specified health status: Secondary | ICD-10-CM

## 2022-11-21 DIAGNOSIS — O99612 Diseases of the digestive system complicating pregnancy, second trimester: Secondary | ICD-10-CM

## 2022-11-21 DIAGNOSIS — O0992 Supervision of high risk pregnancy, unspecified, second trimester: Secondary | ICD-10-CM

## 2022-11-21 HISTORY — DX: History of uterine scar from previous surgery: Z98.891

## 2022-11-21 MED ORDER — PANTOPRAZOLE SODIUM 40 MG PO TBEC: 40.0000 mg | DELAYED_RELEASE_TABLET | Freq: Two times a day (BID) | ORAL | 2 refills | Status: DC

## 2022-11-21 MED ORDER — PRENATAL PLUS 27-1 MG PO TABS: 1.0000 | ORAL_TABLET | Freq: Every day | ORAL | 11 refills | Status: DC

## 2022-11-21 NOTE — Patient Instructions (Signed)

## 2022-11-21 NOTE — Progress Notes (Signed)
   Subjective:  Lauren Ellis is a 42 y.o. [phone] at [redacted]w[redacted]d being seen today for ongoing prenatal care.  She is currently monitored for the following issues for this high-risk pregnancy and has Left breast mass; Low back pain; Palpitations; SOB (shortness of breath); Scoliosis of lumbar spine; Anxiety; AMA (advanced maternal age) multigravida 38+; Supervision of high risk pregnancy, antepartum; History of C-section; Language barrier; Prediabetes; and History of VBAC on their problem list.  Patient reports no complaints.  Contractions: Not present. Vag. Bleeding: None.  Movement: Present. Denies leaking of fluid.   The following portions of the patient's history were reviewed and updated as appropriate: allergies, current medications, past family history, past medical history, past social history, past surgical history and problem list. Problem list updated.  Objective:   Vitals:   11/21/22 1141  BP: (!) 117/55  Pulse: 70  Weight: 168 lb 1.6 oz (76.2 kg)    Fetal Status: Fetal Heart Rate (bpm): 146   Movement: Present     General:  Alert, oriented and cooperative. Patient is in no acute distress.  Skin: Skin is warm and dry. No rash noted.   Cardiovascular: Normal heart rate noted  Respiratory: Normal respiratory effort, no problems with respiration noted  Abdomen: Soft, gravid, appropriate for gestational age. Pain/Pressure: Absent     Pelvic: Vag. Bleeding: None     Cervical exam deferred        Extremities: Normal range of motion.  Edema: Moderate pitting, indentation subsides rapidly  Mental Status: Normal mood and affect. Normal behavior. Normal judgment and thought content.   Urinalysis:      Assessment and Plan:  Pregnancy: G5P4004 at [redacted]w[redacted]d  1. Supervision of high risk pregnancy, antepartum BP and FHR normal Reports pain in her chest that rises from her stomach, more with lying down. Is taking pepcid twice a day. Happens sporadically, twice this week. Lasts  for about 15 minutes. No associated sweating or nausea. Happens with rest, not with activity. No family hx of heart problems.  Suspect uncontrolled GERD, rx sent for PPI, further workup if not effective  2. History of VBAC CS>VBAC x3, sign TOLAC consent next visit  3. Language barrier Arabic, [facility] live interpreter Margo Aye present throughout visit  4. Multigravida of advanced maternal age in second trimester Antenatal testing starting at 60 wks per MFM guidelines  Preterm labor symptoms and general obstetric precautions including but not limited to vaginal bleeding, contractions, leaking of fluid and fetal movement were reviewed in detail with the patient. Please refer to After Visit Summary for other counseling recommendations.  Return in 4 weeks (on 12/19/2022) for [facility], ob visit.   Clarnce Flock, MD

## 2022-12-18 NOTE — Progress Notes (Unsigned)
PRENATAL VISIT NOTE  Subjective:  [address] Lauren Ellis is a 42 y.o. [phone] at 6w1dbeing seen today for ongoing prenatal care.  She is currently monitored for the following issues for this high-risk pregnancy and has Left breast mass; Scoliosis of lumbar spine; Anxiety; AMA (advanced maternal age) multigravida 368+ Supervision of high risk pregnancy, antepartum; History of C-section; Language barrier; Prediabetes; and History of VBAC on their problem list.  Patient reports {sx:14538}.   .  .   . Denies leaking of fluid.   The following portions of the patient's history were reviewed and updated as appropriate: allergies, current medications, past family history, past medical history, past social history, past surgical history and problem list.   Objective:  There were no vitals filed for this visit.  Fetal Status:           General:  Alert, oriented and cooperative. Patient is in no acute distress.  Skin: Skin is warm and dry. No rash noted.   Cardiovascular: Normal heart rate noted  Respiratory: Normal respiratory effort, no problems with respiration noted  Abdomen: Soft, gravid, appropriate for gestational age.        Pelvic: {Blank single:19197::"Cervical exam performed in the presence of a chaperone","Cervical exam deferred"}        Extremities: Normal range of motion.     Mental Status: Normal mood and affect. Normal behavior. Normal judgment and thought content.   Assessment and Plan:  Pregnancy: [id number] 212w1d. Multigravida of advanced maternal age in second trimester  2. Supervision of high risk pregnancy, antepartum Tdap recommended today - *** MOC: *** 28 week labs ***  3. History of C-section Desires VBAC. Has h/o successful VBAC.  - We discussed her history of c-section. Her previous c-section was due to  {Blank single:19197::"arrest of dilation at *** cm","arrest of descent after pushing for *** hr","non-reassuring fetal heart tones","fetal  malpresentation"}.  She has a history of  successful vaginal delivery - We discussed the risks associated with repeat c-section: bleeding, infection, injury to surrounding organs/tissues I.e. bowel/bladder, development of scar tissue, wound complications such as wound separation or infection, need for additional surgery, percreta/acreta - We discussed the risks associated with TOLAC: risk of it being unsuccessful, specially in the context of her history, the risks in general of a vaginal delivery (prolapse, SUI, differences in recovery, pelvic floor dysfunction, etc), and the risk of uterine rupture. We discussed with the risk of uterine rupture that while rare it is not easily predicted, that it is a surgical emergency, and it can be potentially catastrophic for mom and baby. We discussed if uterine rupture that it may necessitate hysterectomy if the rupture caused issues with bleeding that could not be managed with other surgical options.  - After counseling, the patient was given the opportunity to ask questions and all questions answered.  - After considering her options, she would like to TORiversiderovided to the patient   4. Language barrier Arabic interpreter used throughout  5. Prediabetes Early A1C was wnl.   6. History of VBAC See above.   Preterm labor symptoms and general obstetric precautions including but not limited to vaginal bleeding, contractions, leaking of fluid and fetal movement were reviewed in detail with the patient. Please refer to After Visit Summary for other counseling recommendations.   No follow-ups on file.  Future Appointments  Date Time Provider De[place]3/09/2023  8:15 AM [facility]Radene [facility]unningMD [facility]3/13/2024 10:45 AM [facility]  NURSE [facility]  12/20/2022 11:00 AM [facility] US1 [facility]  01/17/2023 10:45 AM [facility] NURSE [facility] [employer]  01/17/2023 11:00 AM [facility] US1 [facility]    Radene Gunning, MD

## 2022-12-19 ENCOUNTER — Encounter: Payer: Self-pay | Admitting: Obstetrics and Gynecology

## 2022-12-19 ENCOUNTER — Ambulatory Visit (INDEPENDENT_AMBULATORY_CARE_PROVIDER_SITE_OTHER): Payer: Medicaid Other | Admitting: Obstetrics and Gynecology

## 2022-12-19 VITALS — BP 112/73 | HR 77 | Wt 173.5 lb

## 2022-12-19 DIAGNOSIS — O09523 Supervision of elderly multigravida, third trimester: Secondary | ICD-10-CM

## 2022-12-19 DIAGNOSIS — O09522 Supervision of elderly multigravida, second trimester: Secondary | ICD-10-CM

## 2022-12-19 DIAGNOSIS — O0993 Supervision of high risk pregnancy, unspecified, third trimester: Secondary | ICD-10-CM

## 2022-12-19 DIAGNOSIS — O0990 Supervision of high risk pregnancy, unspecified, unspecified trimester: Secondary | ICD-10-CM

## 2022-12-19 DIAGNOSIS — Z789 Other specified health status: Secondary | ICD-10-CM

## 2022-12-19 DIAGNOSIS — R7303 Prediabetes: Secondary | ICD-10-CM

## 2022-12-19 DIAGNOSIS — Z3A28 28 weeks gestation of pregnancy: Secondary | ICD-10-CM

## 2022-12-19 DIAGNOSIS — Z98891 History of uterine scar from previous surgery: Secondary | ICD-10-CM

## 2022-12-20 ENCOUNTER — Other Ambulatory Visit: Payer: Self-pay | Admitting: *Deleted

## 2022-12-20 ENCOUNTER — Ambulatory Visit: Payer: Medicaid Other | Attending: Maternal & Fetal Medicine

## 2022-12-20 ENCOUNTER — Ambulatory Visit: Payer: Medicaid Other | Admitting: *Deleted

## 2022-12-20 VITALS — BP 119/44 | HR 68

## 2022-12-20 DIAGNOSIS — Z3A28 28 weeks gestation of pregnancy: Secondary | ICD-10-CM

## 2022-12-20 DIAGNOSIS — O09523 Supervision of elderly multigravida, third trimester: Secondary | ICD-10-CM

## 2022-12-20 DIAGNOSIS — O0990 Supervision of high risk pregnancy, unspecified, unspecified trimester: Secondary | ICD-10-CM | POA: Insufficient documentation

## 2022-12-20 DIAGNOSIS — Z3689 Encounter for other specified antenatal screening: Secondary | ICD-10-CM

## 2022-12-20 DIAGNOSIS — O34219 Maternal care for unspecified type scar from previous cesarean delivery: Secondary | ICD-10-CM | POA: Diagnosis not present

## 2022-12-20 DIAGNOSIS — Z789 Other specified health status: Secondary | ICD-10-CM

## 2022-12-20 DIAGNOSIS — Z98891 History of uterine scar from previous surgery: Secondary | ICD-10-CM

## 2022-12-20 DIAGNOSIS — O99891 Other specified diseases and conditions complicating pregnancy: Secondary | ICD-10-CM

## 2022-12-20 DIAGNOSIS — R0600 Dyspnea, unspecified: Secondary | ICD-10-CM

## 2022-12-20 DIAGNOSIS — N6320 Unspecified lump in the left breast, unspecified quadrant: Secondary | ICD-10-CM | POA: Diagnosis not present

## 2022-12-22 ENCOUNTER — Other Ambulatory Visit: Payer: Self-pay | Admitting: *Deleted

## 2022-12-22 DIAGNOSIS — Z3689 Encounter for other specified antenatal screening: Secondary | ICD-10-CM

## 2022-12-22 DIAGNOSIS — O09523 Supervision of elderly multigravida, third trimester: Secondary | ICD-10-CM

## 2022-12-22 DIAGNOSIS — O34219 Maternal care for unspecified type scar from previous cesarean delivery: Secondary | ICD-10-CM

## 2023-01-12 ENCOUNTER — Other Ambulatory Visit: Payer: Medicaid Other

## 2023-01-12 ENCOUNTER — Encounter: Payer: Self-pay | Admitting: Obstetrics & Gynecology

## 2023-01-12 ENCOUNTER — Ambulatory Visit (INDEPENDENT_AMBULATORY_CARE_PROVIDER_SITE_OTHER): Payer: Medicaid Other | Admitting: Obstetrics & Gynecology

## 2023-01-12 ENCOUNTER — Other Ambulatory Visit: Payer: Self-pay

## 2023-01-12 VITALS — BP 95/61 | HR 60 | Wt 171.0 lb

## 2023-01-12 DIAGNOSIS — O0990 Supervision of high risk pregnancy, unspecified, unspecified trimester: Secondary | ICD-10-CM

## 2023-01-12 DIAGNOSIS — O0993 Supervision of high risk pregnancy, unspecified, third trimester: Secondary | ICD-10-CM

## 2023-01-12 DIAGNOSIS — Z23 Encounter for immunization: Secondary | ICD-10-CM

## 2023-01-12 DIAGNOSIS — Z98891 History of uterine scar from previous surgery: Secondary | ICD-10-CM

## 2023-01-12 DIAGNOSIS — O09523 Supervision of elderly multigravida, third trimester: Secondary | ICD-10-CM

## 2023-01-12 DIAGNOSIS — Z3A31 31 weeks gestation of pregnancy: Secondary | ICD-10-CM

## 2023-01-12 NOTE — Progress Notes (Signed)
PRENATAL VISIT NOTE  Subjective:  [address] Lauren Ellis is a 42 y.o. [phone] at [redacted]w[redacted]d being seen today for ongoing prenatal care.  [employer] Arabic virtual interpreter used for this visit. She is currently monitored for the following issues for this high-risk pregnancy and has Left breast mass; Scoliosis of lumbar spine; Anxiety; AMA (advanced maternal age) multigravida 63+; Supervision of high risk pregnancy, antepartum; History of C-section; Language barrier; Prediabetes; and History of VBAC on their problem list.  Patient reports no complaints.  Contractions: Not present. Vag. Bleeding: None.  Movement: Present. Denies leaking of fluid.   The following portions of the patient's history were reviewed and updated as appropriate: allergies, current medications, past family history, past medical history, past social history, past surgical history and problem list.   Objective:   Vitals:   01/12/23 0830  BP: 95/61  Pulse: 60  Weight: 171 lb (77.6 kg)    Fetal Status: Fetal Heart Rate (bpm): 137   Movement: Present     General:  Alert, oriented and cooperative. Patient is in no acute distress.  Skin: Skin is warm and dry. No rash noted.   Cardiovascular: Normal heart rate noted  Respiratory: Normal respiratory effort, no problems with respiration noted  Abdomen: Soft, gravid, appropriate for gestational age.  Pain/Pressure: Absent     Pelvic: Cervical exam deferred        Extremities: Normal range of motion.     Mental Status: Normal mood and affect. Normal behavior. Normal judgment and thought content.   Imaging: [place] MFM OB FOLLOW UP  Result Date: 12/20/2022 ----------------------------------------------------------------------  OBSTETRICS REPORT                       (Signed Final 12/20/2022 12:07 pm) ---------------------------------------------------------------------- Patient Info  ID #:       [id number]                          D.O.B.:  May 27, 1981 (42 yrs)  Name:       Lauren Ellis                 Visit Date: 12/20/2022 10:48 am              KHALID Alcalde ---------------------------------------------------------------------- Performed By  Attending:        Ma Rings MD         Referred By:      Myrtie Hawk  Performed By:     Earley Brooke     [facility]                    BS, RDMS                                 [facility] ---------------------------------------------------------------------- Orders  #  Description                           Code        Ordered By  1  [place] MFM OB FOLLOW UP                   [id number]    Lin Landsman ----------------------------------------------------------------------  #  Order #                     Accession #                Episode #  1  [id number]                   [id number]                 [id number] ---------------------------------------------------------------------- Indications  Advanced maternal age multigravida 56+,        O66.523  third trimester (42 y.o)  Medical complication of pregnancy              O26.90  (dyspnea, maternal ECHO WNL, left breast  mass)  History of cesarean delivery, currently        O34.219  pregnant  [redacted] weeks gestation of pregnancy                Z3A.28  LR NIPS, Neg Horizon  Encounter for antenatal screening for          Z36.3  malformations ---------------------------------------------------------------------- Fetal Evaluation  Num Of Fetuses:         1  Fetal Heart Rate(bpm):  155  Cardiac Activity:       Observed  Presentation:           Cephalic  Placenta:               Posterior  P. Cord Insertion:      Previously visualized  Amniotic Fluid  AFI FV:      Within normal limits  AFI Sum(cm)     %Tile       Largest  Pocket(cm)  16.9            63          4.62  RUQ(cm)       RLQ(cm)       LUQ(cm)        LLQ(cm)  3.85          4.01          4.42           4.62 ---------------------------------------------------------------------- Biometry  BPD:      70.3  mm     G. Age:  28w 2d  36  %    CI:        74.06   %    70 - 86                                                          FL/HC:      21.5   %    18.8 - 20.6  HC:      259.4  mm     G. Age:  28w 1d         17  %    HC/AC:      1.12        1.05 - 1.21  AC:       232   mm     G. Age:  27w 4d         22  %    FL/BPD:     79.5   %    71 - 87  FL:       55.9  mm     G. Age:  29w 3d         68  %    FL/AC:      24.1   %    20 - 24  HUM:      47.8  mm     G. Age:  28w 0d         41  %  LV:        3.5  mm  Est. FW:    1205  gm    2 lb 11 oz      37  % ---------------------------------------------------------------------- OB History  Gravidity:    5         Term:   4  Living:       4 ---------------------------------------------------------------------- Gestational Age  LMP:           29w 2d        Date:  05/29/22                  EDD:   03/05/23  U/S Today:     28w 3d                                        EDD:   03/11/23  Best:          28w 2d     Det. By:  U/S  (10/10/22)          EDD:   03/12/23 ---------------------------------------------------------------------- Anatomy  Cranium:               Appears normal         Aortic Arch:            Previously seen  Cavum:                 Previously seen        Ductal Arch:            Previously seen  Ventricles:            Appears normal         Diaphragm:  Appears normal  Choroid Plexus:        Previously seen        Stomach:                Appears normal, left                                                                        sided  Cerebellum:            Previously seen        Abdomen:                Previously seen  Posterior Fossa:       Previously seen        Abdominal Wall:         Previously seen  Nuchal Fold:            Previously seen        Cord Vessels:           Previously seen  Face:                  Profile normal.        Kidneys:                Appear normal                         Orbits prev seen  Lips:                  Previously seen        Bladder:                Appears normal  Thoracic:              Appears normal         Spine:                  Previously seen  Heart:                 Appears normal         Upper Extremities:      Previously seen                         (4CH, axis, and                         situs)  RVOT:                  Appears normal         Lower Extremities:      Previously seen  LVOT:                  Appears normal  Other:  VC, 3VV and 3VTV prev visualized. Nasal bone, lenses, maxilla,          mandible and falx prev visualized. Heels/feet and open hands/5th          digits prev visualized. Technically difficult due to fetal position. ---------------------------------------------------------------------- Cervix Uterus Adnexa  Cervix  Not visualized (advanced GA >24wks) ---------------------------------------------------------------------- Comments  This patient was seen for  a follow up growth scan due to  advanced maternal age (42 years old).  She denies any  problems since her last exam.  She was informed that the fetal growth and amniotic fluid  level appears appropriate for her gestational age.  Due to advanced maternal age greater than 40, we will start  weekly fetal testing at 36 weeks.  A follow-up growth scan was scheduled in 4 weeks.  All conversations were held with the patient today with the  help of an Arabic interpreter. ----------------------------------------------------------------------                   Ma RingsVictor Fang, MD Electronically Signed Final Report   12/20/2022 12:07 pm ----------------------------------------------------------------------   Assessment and Plan:  Pregnancy: [id number] at 8832w4d 1. Multigravida of advanced maternal age in third trimester Already  scheduled for growth scans and antenatal testing as per MFM.  2. History of VBAC Already consented for TOLAC on 12/19/22  3. Need for diphtheria-tetanus-pertussis (Tdap) vaccine - Tdap vaccine greater than or equal to 7yo IM given today.  4. [redacted] weeks gestation of pregnancy 5. Supervision of high risk pregnancy, antepartum Discussed contraception, considering POPs. Will return for GTT and labs, was not fasting today. Preterm labor symptoms and general obstetric precautions including but not limited to vaginal bleeding, contractions, leaking of fluid and fetal movement were reviewed in detail with the patient. Please refer to After Visit Summary for other counseling recommendations.   Return in about 3 days (around 01/15/2023) for Fasting lab appointment  2 weeks: OFFICE OB VISIT (MD or APP).  Future Appointments  Date Time Provider Department Center  01/12/2023  9:55 AM Ravon Mcilhenny, Jethro BastosUgonna A, MD [facility]  01/15/2023  8:20 AM [facility]  01/17/2023 10:45 AM [facility]  01/17/2023 11:00 AM [facility]  02/14/2023 10:30 AM [facility]  02/14/2023 10:45 AM [facility]  02/21/2023 10:30 AM [facility]  02/21/2023 10:45 AM [facility]  02/28/2023 10:30 AM [facility]  02/28/2023 10:45 AM [facility] US4 [facility]    Jaynie CollinsUgonna Diamante Truszkowski, MD

## 2023-01-15 ENCOUNTER — Other Ambulatory Visit: Payer: Self-pay | Admitting: *Deleted

## 2023-01-15 ENCOUNTER — Other Ambulatory Visit: Payer: Medicaid Other

## 2023-01-15 ENCOUNTER — Other Ambulatory Visit: Payer: Self-pay

## 2023-01-15 DIAGNOSIS — O0990 Supervision of high risk pregnancy, unspecified, unspecified trimester: Secondary | ICD-10-CM

## 2023-01-15 DIAGNOSIS — K219 Gastro-esophageal reflux disease without esophagitis: Secondary | ICD-10-CM

## 2023-01-15 MED ORDER — FAMOTIDINE 20 MG PO TABS: 20.0000 mg | ORAL_TABLET | Freq: Two times a day (BID) | ORAL | 3 refills | Status: DC

## 2023-01-16 ENCOUNTER — Encounter: Payer: Self-pay | Admitting: Obstetrics & Gynecology

## 2023-01-16 ENCOUNTER — Other Ambulatory Visit: Payer: Self-pay | Admitting: Obstetrics & Gynecology

## 2023-01-16 DIAGNOSIS — O24419 Gestational diabetes mellitus in pregnancy, unspecified control: Secondary | ICD-10-CM

## 2023-01-16 DIAGNOSIS — O99013 Anemia complicating pregnancy, third trimester: Secondary | ICD-10-CM | POA: Insufficient documentation

## 2023-01-16 HISTORY — DX: Gestational diabetes mellitus in pregnancy, unspecified control: O24.419

## 2023-01-16 LAB — HIV ANTIBODY (ROUTINE TESTING W REFLEX): HIV Screen 4th Generation wRfx: NONREACTIVE

## 2023-01-16 LAB — CBC
Hematocrit: 31.4 % — ABNORMAL LOW (ref 34.0–46.6)
Hemoglobin: 10.9 g/dL — ABNORMAL LOW (ref 11.1–15.9)
MCH: 31.6 pg (ref 26.6–33.0)
MCHC: 34.7 g/dL (ref 31.5–35.7)
MCV: 91 fL (ref 79–97)
Platelets: 199 10*3/uL (ref 150–450)
RBC: 3.45 x10E6/uL — ABNORMAL LOW (ref 3.77–5.28)
RDW: 12.9 % (ref 11.7–15.4)
WBC: 6.9 10*3/uL (ref 3.4–10.8)

## 2023-01-16 LAB — GLUCOSE TOLERANCE, 2 HOURS W/ 1HR
Glucose, 1 hour: 171 mg/dL (ref 70–179)
Glucose, 2 hour: 205 mg/dL — ABNORMAL HIGH (ref 70–152)
Glucose, Fasting: 98 mg/dL — ABNORMAL HIGH (ref 70–91)

## 2023-01-16 LAB — RPR: RPR Ser Ql: NONREACTIVE

## 2023-01-16 MED ORDER — FERRIC MALTOL 30 MG PO CAPS
1.0000 | ORAL_CAPSULE | Freq: Two times a day (BID) | ORAL | 2 refills | Status: DC
Start: 2023-01-16 — End: 2023-02-14

## 2023-01-16 MED ORDER — ACCU-CHEK SOFTCLIX LANCETS MISC
12 refills | Status: DC
Start: 2023-01-16 — End: 2023-02-28

## 2023-01-16 MED ORDER — ACCU-CHEK GUIDE VI STRP
ORAL_STRIP | 12 refills | Status: DC
Start: 2023-01-16 — End: 2023-02-28

## 2023-01-16 MED ORDER — ACCU-CHEK GUIDE W/DEVICE KIT
1.0000 | PACK | Freq: Four times a day (QID) | 0 refills | Status: DC
Start: 2023-01-16 — End: 2023-02-28

## 2023-01-16 NOTE — Progress Notes (Signed)
Patient's 2 hr GTT is abnormal and is consistent with Gestational Diabetes DM education referral and testing supplies ordered. Please call to inform patient of results and recommendations. Arabic speaking.    Jaynie Collins, MD

## 2023-01-17 ENCOUNTER — Ambulatory Visit: Payer: Medicaid Other | Attending: Maternal & Fetal Medicine

## 2023-01-17 ENCOUNTER — Telehealth: Payer: Self-pay | Admitting: General Practice

## 2023-01-17 ENCOUNTER — Ambulatory Visit: Payer: Medicaid Other | Admitting: *Deleted

## 2023-01-17 VITALS — BP 103/52 | HR 72

## 2023-01-17 DIAGNOSIS — Z98891 History of uterine scar from previous surgery: Secondary | ICD-10-CM | POA: Diagnosis present

## 2023-01-17 DIAGNOSIS — O34219 Maternal care for unspecified type scar from previous cesarean delivery: Secondary | ICD-10-CM | POA: Diagnosis present

## 2023-01-17 DIAGNOSIS — Z603 Acculturation difficulty: Secondary | ICD-10-CM | POA: Diagnosis present

## 2023-01-17 DIAGNOSIS — O0990 Supervision of high risk pregnancy, unspecified, unspecified trimester: Secondary | ICD-10-CM | POA: Insufficient documentation

## 2023-01-17 DIAGNOSIS — R0600 Dyspnea, unspecified: Secondary | ICD-10-CM | POA: Diagnosis not present

## 2023-01-17 DIAGNOSIS — O09523 Supervision of elderly multigravida, third trimester: Secondary | ICD-10-CM

## 2023-01-17 DIAGNOSIS — O24419 Gestational diabetes mellitus in pregnancy, unspecified control: Secondary | ICD-10-CM | POA: Diagnosis not present

## 2023-01-17 DIAGNOSIS — O99891 Other specified diseases and conditions complicating pregnancy: Secondary | ICD-10-CM | POA: Diagnosis not present

## 2023-01-17 DIAGNOSIS — Z758 Other problems related to medical facilities and other health care: Secondary | ICD-10-CM | POA: Insufficient documentation

## 2023-01-17 DIAGNOSIS — Z3689 Encounter for other specified antenatal screening: Secondary | ICD-10-CM | POA: Insufficient documentation

## 2023-01-17 DIAGNOSIS — Z3A32 32 weeks gestation of pregnancy: Secondary | ICD-10-CM

## 2023-01-17 DIAGNOSIS — N6320 Unspecified lump in the left breast, unspecified quadrant: Secondary | ICD-10-CM

## 2023-01-17 NOTE — Telephone Encounter (Addendum)
-----   Message from Tereso Newcomer, MD sent at 01/16/2023 12:30 PM EDT ----- Patient's 2 hr GTT is abnormal and is consistent with Gestational Diabetes DM education referral and testing supplies ordered. Please call to inform patient of results and recommendations. Arabic speaking.  Oral iron therapy prescribed for mild anenmia.  Please call to inform patient of results and advise her to pick up prescription and take as directed.  Arabic speaking.   Jaynie Collins, MD

## 2023-01-17 NOTE — Telephone Encounter (Signed)
Called patient with pac[employer] interpreter [phone] and informed her of all results and prescriptions sent to pharmacy. Patient verbalized understanding and is aware of diabetes education appt already set up for this Friday. Patient asked if this was serious or worrisome for the baby. Told patient as long as she follows our diet recommendations, checks her blood sugars, and brings her logs in to her appointments, we will do our best to help keep her blood sugars under control to minimize risk to baby. Patient verbalized understanding and asked if this was permanent or would go away after pregnancy. Told patient we would test her again after pregnancy to make sure it has gone away. Advised patient to pick up prescriptions and bring to her appt on Friday. Patient verbalized understanding to all.

## 2023-01-19 ENCOUNTER — Encounter: Payer: Medicaid Other | Attending: Obstetrics & Gynecology | Admitting: Registered"

## 2023-01-19 ENCOUNTER — Encounter: Payer: Self-pay | Admitting: Registered"

## 2023-01-19 DIAGNOSIS — O24419 Gestational diabetes mellitus in pregnancy, unspecified control: Secondary | ICD-10-CM

## 2023-01-19 NOTE — Patient Instructions (Signed)
Pick up strips and lancets at your pharmacy You do not need the Meter prescription because I gave you a meter today

## 2023-01-19 NOTE — Progress Notes (Signed)
Patient was seen for Gestational Diabetes self-management on 01/19/23  Start time 0930 and End time 1045   Estimated due date: 03/12/23; [redacted]w[redacted]d  In-person Interpreter [employer]   Clinical: Medications: reviewed Medical History: reviewed Labs:    Lab Results  Component Value Date   HGBA1C 5.6 07/27/2022   Dietary and Lifestyle History: Pt reports 2 days ago ended Ramadan fasting and her physical activity was limited. Pt states now that she is back to eating regular meals can start increasing physical activity.  Physical Activity: ADL Stress: not assessed Sleep: not assessed  24 hr Recall: not a full assessment First Meal: cheese, egg, white bread OR falafel, cream cheese, olive oil. Snack: Second meal: Snack: Third meal: Snack: Beverages: water, milk, red tea with milk (Nido), occasional black coffee  NUTRITION INTERVENTION  Nutrition education (E-1) on the following topics:   Initial Follow-up  [x]  []  Definition of Gestational Diabetes []  []  Why dietary management is important in controlling blood glucose [x]  []  Effects each nutrient has on blood glucose levels []  []  Simple carbohydrates vs complex carbohydrates [x]  []  Fluid intake [x]  []  Creating a balanced meal plan []  []  Carbohydrate counting  [x]  []  When to check blood glucose levels [x]  []  Proper blood glucose monitoring techniques [x]  []  Effect of stress and stress reduction techniques  [x]  []  Exercise effect on blood glucose levels, appropriate exercise during pregnancy []  []  Importance of limiting caffeine and abstaining from alcohol and smoking [x]  []  Medications used for blood sugar control during pregnancy [x]  []  Hypoglycemia and rule of 15 [x]  []  Postpartum self care  Blood glucose monitor given: Accu-chek Guide Me Lot #[id number] Exp: 2025-18-06 CBG: 155 mg/dL (pt had cabbage and 3 cookies before visit)  Patient instructed to monitor glucose levels: FBS: 60 - ? 95 mg/dL; 2 hour: ? 233  mg/dL  Patient received handouts: Nutrition Diabetes and Pregnancy Carbohydrate Counting List  Patient will be seen for follow-up in 2 weeks or as needed.

## 2023-01-31 ENCOUNTER — Ambulatory Visit (INDEPENDENT_AMBULATORY_CARE_PROVIDER_SITE_OTHER): Payer: Medicaid Other | Admitting: Obstetrics & Gynecology

## 2023-01-31 ENCOUNTER — Other Ambulatory Visit: Payer: Self-pay

## 2023-01-31 ENCOUNTER — Other Ambulatory Visit (HOSPITAL_COMMUNITY)
Admission: RE | Admit: 2023-01-31 | Discharge: 2023-01-31 | Disposition: A | Discharge status: Home / Self Care | Payer: Medicaid Other | Source: Ambulatory Visit | Attending: Obstetrics & Gynecology | Admitting: Obstetrics & Gynecology

## 2023-01-31 VITALS — BP 108/62 | HR 79 | Wt 175.6 lb

## 2023-01-31 DIAGNOSIS — O09523 Supervision of elderly multigravida, third trimester: Secondary | ICD-10-CM

## 2023-01-31 DIAGNOSIS — O0990 Supervision of high risk pregnancy, unspecified, unspecified trimester: Secondary | ICD-10-CM | POA: Insufficient documentation

## 2023-01-31 DIAGNOSIS — N898 Other specified noninflammatory disorders of vagina: Secondary | ICD-10-CM | POA: Diagnosis present

## 2023-01-31 DIAGNOSIS — O99013 Anemia complicating pregnancy, third trimester: Secondary | ICD-10-CM

## 2023-01-31 DIAGNOSIS — O24419 Gestational diabetes mellitus in pregnancy, unspecified control: Secondary | ICD-10-CM

## 2023-01-31 DIAGNOSIS — Z3A34 34 weeks gestation of pregnancy: Secondary | ICD-10-CM

## 2023-01-31 DIAGNOSIS — O0993 Supervision of high risk pregnancy, unspecified, third trimester: Secondary | ICD-10-CM

## 2023-01-31 MED ORDER — METFORMIN HCL 500 MG PO TABS
500.0000 mg | ORAL_TABLET | Freq: Two times a day (BID) | ORAL | 5 refills | Status: DC
Start: 2023-01-31 — End: 2023-02-28

## 2023-01-31 NOTE — Progress Notes (Signed)
  Pt states that she is having Vaginal itching on the outside, no odor ,nor irritation.

## 2023-01-31 NOTE — Progress Notes (Signed)
   PRENATAL VISIT NOTE  Subjective:  [address] Lauren Ellis is a 42 y.o. [phone] at [redacted]w[redacted]d being seen today for ongoing prenatal care.  She is currently monitored for the following issues for this high-risk pregnancy and has Left breast mass; Scoliosis of lumbar spine; Anxiety; AMA (advanced maternal age) multigravida 51+; Supervision of high risk pregnancy, antepartum; History of C-section; Language barrier; Prediabetes; History of VBAC; GDM (gestational diabetes mellitus); and Anemia in pregnancy, third trimester on their problem list.  Patient reports no complaints.  Contractions: Not present. Vag. Bleeding: None.  Movement: Present. Denies leaking of fluid.   The following portions of the patient's history were reviewed and updated as appropriate: allergies, current medications, past family history, past medical history, past social history, past surgical history and problem list.   Objective:   Vitals:   01/31/23 1131  BP: 108/62  Pulse: 79  Weight: 175 lb 9.6 oz (79.7 kg)    Fetal Status: Fetal Heart Rate (bpm): 146   Movement: Present     General:  Alert, oriented and cooperative. Patient is in no acute distress.  Skin: Skin is warm and dry. No rash noted.   Cardiovascular: Normal heart rate noted  Respiratory: Normal respiratory effort, no problems with respiration noted  Abdomen: Soft, gravid, appropriate for gestational age.  Pain/Pressure: Present     Pelvic:         Extremities: Normal range of motion.  Edema: Trace  Mental Status: Normal mood and affect. Normal behavior. Normal judgment and thought content.   Assessment and Plan:  Pregnancy: G5P4004 at [redacted]w[redacted]d 1. Supervision of high risk pregnancy, antepartum  - Cervicovaginal ancillary only( [place])  2. Gestational diabetes mellitus (GDM), antepartum, gestational diabetes method of control unspecified FBS most >100 and PP up to 188, add medication - metFORMIN (GLUCOPHAGE) 500 MG tablet; Take 1 tablet (500  mg total) by mouth 2 (two) times daily with a meal.  Dispense: 60 tablet; Refill: 5  3. Anemia in pregnancy, third trimester   4. Multigravida of advanced maternal age in third trimester   5. Itching in the vaginal area  - Cervicovaginal ancillary only( [place])  Preterm labor symptoms and general obstetric precautions including but not limited to vaginal bleeding, contractions, leaking of fluid and fetal movement were reviewed in detail with the patient. Please refer to After Visit Summary for other counseling recommendations.   Return in about 2 weeks (around 02/14/2023).  Future Appointments  Date Time Provider Department Center  02/06/2023  9:15 AM [facility]  02/14/2023  8:55 AM Hermina Staggers, MD [facility]  02/14/2023 10:30 AM [facility] NURSE [facility]  02/14/2023 10:45 AM [facility] US4 [facility]  02/21/2023 10:30 AM [facility] NURSE [facility]  02/21/2023 10:45 AM [facility] US5 [facility]  02/28/2023 10:30 AM [facility] NURSE [facility]  02/28/2023 10:45 AM [facility] US4 [facility]    Scheryl Darter, MD

## 2023-02-01 LAB — CERVICOVAGINAL ANCILLARY ONLY
Bacterial Vaginitis (gardnerella): NEGATIVE
Candida Glabrata: NEGATIVE
Candida Vaginitis: POSITIVE — AB
Chlamydia: NEGATIVE
Comment: NEGATIVE
Comment: NEGATIVE
Comment: NEGATIVE
Comment: NEGATIVE
Comment: NEGATIVE
Comment: NORMAL
Neisseria Gonorrhea: NEGATIVE
Trichomonas: NEGATIVE

## 2023-02-06 ENCOUNTER — Ambulatory Visit (INDEPENDENT_AMBULATORY_CARE_PROVIDER_SITE_OTHER): Payer: Medicaid Other | Admitting: Registered"

## 2023-02-06 ENCOUNTER — Encounter: Payer: Medicaid Other | Attending: Obstetrics & Gynecology | Admitting: Registered"

## 2023-02-06 DIAGNOSIS — O24419 Gestational diabetes mellitus in pregnancy, unspecified control: Secondary | ICD-10-CM

## 2023-02-06 DIAGNOSIS — Z3A35 35 weeks gestation of pregnancy: Secondary | ICD-10-CM

## 2023-02-06 NOTE — Progress Notes (Signed)
Patient was seen for Gestational Diabetes self-management on 02/06/2023  Start time 0924 and End time 1015    Estimated due date: 03/12/23; [redacted]w[redacted]d  Next Ob visit 02/14/23 with Alysia Penna   In-person Interpreter Hanan Elkonti from [employer]    Clinical: Medications: Metformin 500 mg with breakfast and dinner started 4/25 Medical History: reviewed Labs:    Recent Labs       Lab Results  Component Value Date    HGBA1C 5.6 07/27/2022      Dietary and Lifestyle History: Pt forgot log sheet but reports SMBG: FBS: 98-122 mg/dL PPBG: 244, 010, 272 Pt states she has been taking metformin 500 mg bid x5 days.   Pt states she would like to continue making changes to diet and exercise before increasing medication.  Pt reports her dinner time varies between 7 - 9 pm. Pt states she has noticed a difference in FBS when eating later dinner.   Physical Activity: Pt states she has not started exercising yet. Pt reports she is willing to start and will aim for 30 min per day. Reviewed activities she can do in the house with small space because she states there is too much traffic in her neighborhood. Advised patient to start slow and wear a pregnancy belt for support.  Diet: Pt reports sometimes she eats white bread or white rice but willing to switch to whole wheat bread and brown rice more consistently. Pt states she likes to eat dates as a snack and will reduce the quantity. Patient is only drinking water and some black coffee.  Pt states she is taking fenugreek supplement for blood sugar control. Warned patient to not take any herbal supplements during pregnancy.   Physical Activity: ADL Stress: not assessed Sleep: not assessed   24 hr Recall:  First Meal: scrambled eggs, 2 pieces of wheat toast Snack: small dessert (checked BG 2 hrs pp - 116 mg/dL) Second meal: tuna sandwich on white bread Snack: coffee with 3 dates Third meal: lentils, salad, peanut butter dressing Snack: none Beverages: water,  milk, red tea with milk (Nido), occasional black coffee   NUTRITION INTERVENTION  Nutrition education (E-1) on the following topics:    Initial    Follow-up   [x]         []         Definition of Gestational Diabetes []         []         Why dietary management is important in controlling blood glucose [x]         []         Effects each nutrient has on blood glucose levels []         [x]         Simple carbohydrates vs complex carbohydrates [x]         [x]         Fluid intake [x]         []         Creating a balanced meal plan []         [x]         Carbohydrate counting  [x]         []         When to check blood glucose levels [x]         []         Proper blood glucose monitoring techniques [x]         []         Effect of stress and stress reduction techniques  [x]         []   Exercise effect on blood glucose levels, appropriate exercise during pregnancy []         [x]         Importance of limiting caffeine and abstaining from alcohol and smoking [x]         [x]         Medications used for blood sugar control during pregnancy [x]         []         Hypoglycemia and rule of 15 [x]         [x]         Postpartum self care   Blood glucose monitor given: Accu-chek Guide Me Lot #[id number] Exp: 2025-18-06 CBG: 155 mg/dL (pt had cabbage and 3 cookies before visit)   Patient instructed to monitor glucose levels: FBS: 60 - ? 95 mg/dL; 2 hour: ? 956 mg/dL   Patient received handouts: Glucose log   Patient will be seen for follow-up as needed.

## 2023-02-08 ENCOUNTER — Telehealth: Payer: Self-pay | Admitting: Lactation Services

## 2023-02-08 ENCOUNTER — Other Ambulatory Visit (INDEPENDENT_AMBULATORY_CARE_PROVIDER_SITE_OTHER): Payer: Medicaid Other

## 2023-02-08 ENCOUNTER — Ambulatory Visit: Payer: Medicaid Other | Admitting: *Deleted

## 2023-02-08 VITALS — BP 111/66 | HR 70

## 2023-02-08 DIAGNOSIS — O09523 Supervision of elderly multigravida, third trimester: Secondary | ICD-10-CM

## 2023-02-08 DIAGNOSIS — Z3A35 35 weeks gestation of pregnancy: Secondary | ICD-10-CM

## 2023-02-08 DIAGNOSIS — O24415 Gestational diabetes mellitus in pregnancy, controlled by oral hypoglycemic drugs: Secondary | ICD-10-CM

## 2023-02-08 MED ORDER — TERCONAZOLE 0.4 % VA CREA: 1.0000 | TOPICAL_CREAM | Freq: Every day | VAGINAL | 0 refills | Status: DC

## 2023-02-08 NOTE — Progress Notes (Signed)

## 2023-02-08 NOTE — Telephone Encounter (Signed)
Per Chart review, patient + for yeast. Terazol sent to Pharmacy.   Called patient with assistance of [employer] telephone Arabic Interpreter, # [phone]. Patient informed or + yeast and that prescription was sent to Pharmacy and how to use. Patient voiced understanding.

## 2023-02-13 ENCOUNTER — Encounter: Payer: Self-pay | Admitting: *Deleted

## 2023-02-14 ENCOUNTER — Other Ambulatory Visit: Payer: Self-pay

## 2023-02-14 ENCOUNTER — Ambulatory Visit (INDEPENDENT_AMBULATORY_CARE_PROVIDER_SITE_OTHER): Payer: Medicaid Other | Admitting: Obstetrics and Gynecology

## 2023-02-14 ENCOUNTER — Ambulatory Visit: Payer: Medicaid Other | Admitting: *Deleted

## 2023-02-14 ENCOUNTER — Encounter: Payer: Self-pay | Admitting: Obstetrics and Gynecology

## 2023-02-14 ENCOUNTER — Ambulatory Visit: Payer: Medicaid Other | Attending: Obstetrics

## 2023-02-14 VITALS — BP 111/57 | HR 72

## 2023-02-14 VITALS — BP 107/51 | HR 73 | Wt 177.7 lb

## 2023-02-14 DIAGNOSIS — Z3A36 36 weeks gestation of pregnancy: Secondary | ICD-10-CM | POA: Diagnosis not present

## 2023-02-14 DIAGNOSIS — Z3689 Encounter for other specified antenatal screening: Secondary | ICD-10-CM | POA: Diagnosis present

## 2023-02-14 DIAGNOSIS — O09523 Supervision of elderly multigravida, third trimester: Secondary | ICD-10-CM

## 2023-02-14 DIAGNOSIS — Z758 Other problems related to medical facilities and other health care: Secondary | ICD-10-CM

## 2023-02-14 DIAGNOSIS — Z3A37 37 weeks gestation of pregnancy: Secondary | ICD-10-CM

## 2023-02-14 DIAGNOSIS — O24410 Gestational diabetes mellitus in pregnancy, diet controlled: Secondary | ICD-10-CM

## 2023-02-14 DIAGNOSIS — O34219 Maternal care for unspecified type scar from previous cesarean delivery: Secondary | ICD-10-CM | POA: Insufficient documentation

## 2023-02-14 DIAGNOSIS — Z603 Acculturation difficulty: Secondary | ICD-10-CM

## 2023-02-14 DIAGNOSIS — O0993 Supervision of high risk pregnancy, unspecified, third trimester: Secondary | ICD-10-CM

## 2023-02-14 DIAGNOSIS — O24415 Gestational diabetes mellitus in pregnancy, controlled by oral hypoglycemic drugs: Secondary | ICD-10-CM | POA: Diagnosis present

## 2023-02-14 DIAGNOSIS — O0990 Supervision of high risk pregnancy, unspecified, unspecified trimester: Secondary | ICD-10-CM

## 2023-02-14 DIAGNOSIS — Z98891 History of uterine scar from previous surgery: Secondary | ICD-10-CM

## 2023-02-14 DIAGNOSIS — O99013 Anemia complicating pregnancy, third trimester: Secondary | ICD-10-CM

## 2023-02-14 NOTE — Progress Notes (Signed)
Subjective:  Lauren Ellis is a 42 y.o. [phone] at [redacted]w[redacted]d being seen today for ongoing prenatal care.  She is currently monitored for the following issues for this high-risk pregnancy and has AMA (advanced maternal age) multigravida 4+; Supervision of high risk pregnancy, antepartum; History of C-section; Language barrier; Prediabetes; History of VBAC; GDM (gestational diabetes mellitus); and Anemia in pregnancy, third trimester on their problem list.  Patient reports no complaints.  Contractions: Not present. Vag. Bleeding: None.  Movement: Present. Denies leaking of fluid.   The following portions of the patient's history were reviewed and updated as appropriate: allergies, current medications, past family history, past medical history, past social history, past surgical history and problem list. Problem list updated.  Objective:   Vitals:   02/14/23 0842  BP: (!) 107/51  Pulse: 73  Weight: 177 lb 11.2 oz (80.6 kg)    Fetal Status: Fetal Heart Rate (bpm): 145   Movement: Present     General:  Alert, oriented and cooperative. Patient is in no acute distress.  Skin: Skin is warm and dry. No rash noted.   Cardiovascular: Normal heart rate noted  Respiratory: Normal respiratory effort, no problems with respiration noted  Abdomen: Soft, gravid, appropriate for gestational age. Pain/Pressure: Present     Pelvic:  Cervical exam deferred        Extremities: Normal range of motion.  Edema: Trace  Mental Status: Normal mood and affect. Normal behavior. Normal judgment and thought content.   Urinalysis:      Assessment and Plan:  Pregnancy: F6O1308 at [redacted]w[redacted]d  1. Supervision of high risk pregnancy, antepartum Pt to complete self swab Labor precautions reviewed - Culture, beta strep (group b only)  2. Diet controlled gestational diabetes mellitus (GDM) in third trimester CBG's still not in goal range but improved with Metformin. Will increase to 1000 mg bid. Growth and BPP  today IOL scheduled for 39 weeks, indications reviewed with pt  3. Multigravida of advanced maternal age in third trimester Stable  4. Anemia in pregnancy, third trimester Stable  5. History of C-section S/P VBAC  6. History of VBAC TOLAC consented  7. Language barrier Live interrupter used during today's visit  Term labor symptoms and general obstetric precautions including but not limited to vaginal bleeding, contractions, leaking of fluid and fetal movement were reviewed in detail with the patient. Please refer to After Visit Summary for other counseling recommendations.  Return in about 1 week (around 02/21/2023) for OB visit, face to face, MD only.   Hermina Staggers, MD

## 2023-02-18 ENCOUNTER — Other Ambulatory Visit: Payer: Self-pay | Admitting: Family Medicine

## 2023-02-18 DIAGNOSIS — O99619 Diseases of the digestive system complicating pregnancy, unspecified trimester: Secondary | ICD-10-CM

## 2023-02-18 LAB — CULTURE, BETA STREP (GROUP B ONLY): Strep Gp B Culture: NEGATIVE

## 2023-02-20 ENCOUNTER — Telehealth (HOSPITAL_COMMUNITY): Payer: Self-pay | Admitting: *Deleted

## 2023-02-20 NOTE — Telephone Encounter (Signed)
Preadmission screen interpreter number [id number] 

## 2023-02-21 ENCOUNTER — Telehealth (HOSPITAL_COMMUNITY): Payer: Self-pay | Admitting: *Deleted

## 2023-02-21 ENCOUNTER — Encounter (HOSPITAL_COMMUNITY): Payer: Self-pay | Admitting: *Deleted

## 2023-02-21 ENCOUNTER — Ambulatory Visit: Payer: Medicaid Other | Attending: Obstetrics

## 2023-02-21 ENCOUNTER — Other Ambulatory Visit: Payer: Self-pay | Admitting: Advanced Practice Midwife

## 2023-02-21 ENCOUNTER — Other Ambulatory Visit: Payer: Self-pay | Admitting: Obstetrics

## 2023-02-21 ENCOUNTER — Ambulatory Visit: Payer: Medicaid Other | Admitting: *Deleted

## 2023-02-21 ENCOUNTER — Ambulatory Visit: Payer: Medicaid Other

## 2023-02-21 VITALS — BP 124/66 | HR 70

## 2023-02-21 DIAGNOSIS — Z3689 Encounter for other specified antenatal screening: Secondary | ICD-10-CM

## 2023-02-21 DIAGNOSIS — O09523 Supervision of elderly multigravida, third trimester: Secondary | ICD-10-CM | POA: Insufficient documentation

## 2023-02-21 DIAGNOSIS — O24415 Gestational diabetes mellitus in pregnancy, controlled by oral hypoglycemic drugs: Secondary | ICD-10-CM | POA: Diagnosis not present

## 2023-02-21 DIAGNOSIS — Z98891 History of uterine scar from previous surgery: Secondary | ICD-10-CM | POA: Insufficient documentation

## 2023-02-21 DIAGNOSIS — O24419 Gestational diabetes mellitus in pregnancy, unspecified control: Secondary | ICD-10-CM | POA: Insufficient documentation

## 2023-02-21 DIAGNOSIS — O34219 Maternal care for unspecified type scar from previous cesarean delivery: Secondary | ICD-10-CM | POA: Diagnosis not present

## 2023-02-21 DIAGNOSIS — Z3A37 37 weeks gestation of pregnancy: Secondary | ICD-10-CM

## 2023-02-21 NOTE — Telephone Encounter (Signed)
[id number] interpreter number Preadmission screen

## 2023-02-21 NOTE — Procedures (Signed)
[facility] 04/29/1981 [redacted]w[redacted]d  Fetus A Non-Stress Test Interpretation for 02/21/23  Indication: Unsatisfactory BPP  Fetal Heart Rate A Mode: External Baseline Rate (A): 130 bpm Variability: Moderate Accelerations: 15 x 15 Decelerations: None Multiple birth?: No  Uterine Activity Mode: Toco Contraction Frequency (min): none Resting Tone Palpated: Relaxed  Interpretation (Fetal Testing) Nonstress Test Interpretation: Reactive Overall Impression: Reassuring for gestational age Comments: Tracing reviewed byDr. Judeth Cornfield

## 2023-02-22 ENCOUNTER — Other Ambulatory Visit: Payer: Self-pay

## 2023-02-22 ENCOUNTER — Ambulatory Visit (INDEPENDENT_AMBULATORY_CARE_PROVIDER_SITE_OTHER): Payer: Medicaid Other | Admitting: Family Medicine

## 2023-02-22 VITALS — BP 116/66 | HR 78 | Wt 178.8 lb

## 2023-02-22 DIAGNOSIS — O0990 Supervision of high risk pregnancy, unspecified, unspecified trimester: Secondary | ICD-10-CM

## 2023-02-22 DIAGNOSIS — O24415 Gestational diabetes mellitus in pregnancy, controlled by oral hypoglycemic drugs: Secondary | ICD-10-CM

## 2023-02-22 DIAGNOSIS — Z3A38 38 weeks gestation of pregnancy: Secondary | ICD-10-CM

## 2023-02-22 DIAGNOSIS — O0993 Supervision of high risk pregnancy, unspecified, third trimester: Secondary | ICD-10-CM

## 2023-02-22 DIAGNOSIS — Z98891 History of uterine scar from previous surgery: Secondary | ICD-10-CM

## 2023-02-22 NOTE — Progress Notes (Signed)
   PRENATAL VISIT NOTE  Subjective:  [address] Lauren Ellis is a 42 y.o. [phone] at [redacted]w[redacted]d being seen today for ongoing prenatal care.  She is currently monitored for the following issues for this high-risk pregnancy and has AMA (advanced maternal age) multigravida 48+; Supervision of high risk pregnancy, antepartum; History of C-section; Language barrier; Prediabetes; History of VBAC; GDM (gestational diabetes mellitus); and Anemia in pregnancy, third trimester on their problem list.  Patient reports  desires an induction that is 1 week later than her currently scheduled induction. She states she is planning a move around the time of her induction. She has not checked her sugars in 3 days but reports normal fasting (88) and postprandial readings of 120. She states she was out of test strips but was called by the pharmacy to come pick them up today .   . Vag. Bleeding: None.  Movement: Present. Denies leaking of fluid.   The following portions of the patient's history were reviewed and updated as appropriate: allergies, current medications, past family history, past medical history, past social history, past surgical history and problem list.   Objective:   Vitals:   02/22/23 1518  BP: 116/66  Pulse: 78  Weight: 178 lb 12.8 oz (81.1 kg)    Fetal Status: Fetal Heart Rate (bpm): 137   Movement: Present     General:  Alert, oriented and cooperative. Patient is in no acute distress.  Skin: Skin is warm and dry. No rash noted.   Cardiovascular: Normal heart rate noted  Respiratory: Normal respiratory effort, no problems with respiration noted  Abdomen: Soft, gravid, appropriate for gestational age.  Pain/Pressure: Absent     Pelvic: Cervical exam deferred        Extremities: Normal range of motion.  Edema: None  Mental Status: Normal mood and affect. Normal behavior. Normal judgment and thought content.   Assessment and Plan:  Pregnancy: G5P4004 at [redacted]w[redacted]d 1. Supervision of high risk  pregnancy, antepartum   2. [redacted] weeks gestation of pregnancy Induction @39  weeks. MAU precuations given.   3. History of VBAC TOLAC Consent signed  4. Gestational diabetes mellitus (GDM) in third trimester controlled on oral hypoglycemic drug No log today. Has not check CBGs in 3 days. Taking metformin. Encouraged to check CBGs. Discussed with patient medical indication to proceed with induction @39  weeks. She voiced understanding.   Term labor symptoms and general obstetric precautions including but not limited to vaginal bleeding, contractions, leaking of fluid and fetal movement were reviewed in detail with the patient. Please refer to After Visit Summary for other counseling recommendations.   No follow-ups on file.  Future Appointments  Date Time Provider Department Center  02/26/2023  7:00 AM [facility] SCHED ROOM [facility] None  02/28/2023 10:30 AM [facility] NURSE [facility]  02/28/2023 10:45 AM [facility] US4 [facility]    Delight Bickle Autry-Lott, DO

## 2023-02-23 ENCOUNTER — Other Ambulatory Visit: Payer: Self-pay | Admitting: Advanced Practice Midwife

## 2023-02-26 ENCOUNTER — Inpatient Hospital Stay (HOSPITAL_COMMUNITY): Payer: Medicaid Other

## 2023-02-26 ENCOUNTER — Inpatient Hospital Stay (HOSPITAL_COMMUNITY): Payer: Medicaid Other | Admitting: Anesthesiology

## 2023-02-26 ENCOUNTER — Encounter (HOSPITAL_COMMUNITY): Payer: Self-pay | Admitting: Obstetrics and Gynecology

## 2023-02-26 ENCOUNTER — Inpatient Hospital Stay (HOSPITAL_COMMUNITY)
Admission: AD | Admit: 2023-02-26 | Discharge: 2023-02-28 | DRG: 807 | Disposition: A | Discharge status: Home / Self Care | Payer: Medicaid Other | Attending: Family Medicine | Admitting: Family Medicine

## 2023-02-26 DIAGNOSIS — O34211 Maternal care for low transverse scar from previous cesarean delivery: Secondary | ICD-10-CM | POA: Diagnosis not present

## 2023-02-26 DIAGNOSIS — O34219 Maternal care for unspecified type scar from previous cesarean delivery: Secondary | ICD-10-CM | POA: Diagnosis present

## 2023-02-26 DIAGNOSIS — O09529 Supervision of elderly multigravida, unspecified trimester: Secondary | ICD-10-CM

## 2023-02-26 DIAGNOSIS — O24424 Gestational diabetes mellitus in childbirth, insulin controlled: Secondary | ICD-10-CM | POA: Diagnosis not present

## 2023-02-26 DIAGNOSIS — Z98891 History of uterine scar from previous surgery: Secondary | ICD-10-CM

## 2023-02-26 DIAGNOSIS — O0990 Supervision of high risk pregnancy, unspecified, unspecified trimester: Secondary | ICD-10-CM

## 2023-02-26 DIAGNOSIS — Z603 Acculturation difficulty: Secondary | ICD-10-CM | POA: Diagnosis present

## 2023-02-26 DIAGNOSIS — Z3A39 39 weeks gestation of pregnancy: Secondary | ICD-10-CM | POA: Diagnosis not present

## 2023-02-26 DIAGNOSIS — O326XX0 Maternal care for compound presentation, not applicable or unspecified: Secondary | ICD-10-CM | POA: Diagnosis not present

## 2023-02-26 DIAGNOSIS — O9902 Anemia complicating childbirth: Secondary | ICD-10-CM | POA: Diagnosis present

## 2023-02-26 DIAGNOSIS — O24419 Gestational diabetes mellitus in pregnancy, unspecified control: Secondary | ICD-10-CM | POA: Diagnosis present

## 2023-02-26 DIAGNOSIS — O09523 Supervision of elderly multigravida, third trimester: Secondary | ICD-10-CM | POA: Diagnosis not present

## 2023-02-26 DIAGNOSIS — O24410 Gestational diabetes mellitus in pregnancy, diet controlled: Secondary | ICD-10-CM

## 2023-02-26 DIAGNOSIS — O24425 Gestational diabetes mellitus in childbirth, controlled by oral hypoglycemic drugs: Principal | ICD-10-CM | POA: Diagnosis present

## 2023-02-26 LAB — COMPREHENSIVE METABOLIC PANEL
ALT: 18 U/L (ref 0–44)
AST: 30 U/L (ref 15–41)
Albumin: 3.1 g/dL — ABNORMAL LOW (ref 3.5–5.0)
Alkaline Phosphatase: 97 U/L (ref 38–126)
Anion gap: 10 (ref 5–15)
BUN: 11 mg/dL (ref 6–20)
CO2: 19 mmol/L — ABNORMAL LOW (ref 22–32)
Calcium: 8.8 mg/dL — ABNORMAL LOW (ref 8.9–10.3)
Chloride: 106 mmol/L (ref 98–111)
Creatinine, Ser: 0.55 mg/dL (ref 0.44–1.00)
GFR, Estimated: >60 mL/min (ref >60)
Glucose, Bld: 85 mg/dL (ref 70–99)
Potassium: 3.7 mmol/L (ref 3.5–5.1)
Sodium: 135 mmol/L (ref 135–145)
Total Bilirubin: 0.3 mg/dL (ref 0.3–1.2)
Total Protein: 6 g/dL — ABNORMAL LOW (ref 6.5–8.1)

## 2023-02-26 LAB — CBC
HCT: 32.5 % — ABNORMAL LOW (ref 36.0–46.0)
Hemoglobin: 11.1 g/dL — ABNORMAL LOW (ref 12.0–15.0)
MCH: 31.2 pg (ref 26.0–34.0)
MCHC: 34.2 g/dL (ref 30.0–36.0)
MCV: 91.3 fL (ref 80.0–100.0)
Platelets: 216 10*3/uL (ref 150–400)
RBC: 3.56 MIL/uL — ABNORMAL LOW (ref 3.87–5.11)
RDW: 14.9 % (ref 11.5–15.5)
WBC: 7 10*3/uL (ref 4.0–10.5)
nRBC: 0 % (ref 0.0–0.2)

## 2023-02-26 LAB — PROTEIN / CREATININE RATIO, URINE
Creatinine, Urine: 47 mg/dL
Total Protein, Urine: <6 mg/dL

## 2023-02-26 LAB — TYPE AND SCREEN
ABO/RH(D): A POS
Antibody Screen: NEGATIVE

## 2023-02-26 LAB — RPR: RPR Ser Ql: NONREACTIVE

## 2023-02-26 LAB — GLUCOSE, CAPILLARY: Glucose-Capillary: 86 mg/dL (ref 70–99)

## 2023-02-26 MED ORDER — FENTANYL-BUPIVACAINE-NACL 0.5-0.125-0.9 MG/250ML-% EP SOLN
12.0000 mL/h | EPIDURAL | Status: DC | PRN
  Administered 2023-02-26: 12 mL/h via EPIDURAL
  Filled 2023-02-26: qty 250

## 2023-02-26 MED ORDER — METHYLERGONOVINE MALEATE 0.2 MG/ML IJ SOLN
INTRAMUSCULAR | Status: AC
  Filled 2023-02-26: qty 1

## 2023-02-26 MED ORDER — PHENYLEPHRINE 80 MCG/ML (10ML) SYRINGE FOR IV PUSH (FOR BLOOD PRESSURE SUPPORT)
80.0000 ug | PREFILLED_SYRINGE | INTRAVENOUS | Status: DC | PRN
  Filled 2023-02-26: qty 10

## 2023-02-26 MED ORDER — LACTATED RINGERS IV SOLN: INTRAVENOUS | Status: DC

## 2023-02-26 MED ORDER — OXYTOCIN-SODIUM CHLORIDE 30-0.9 UT/500ML-% IV SOLN
1.0000 m[IU]/min | INTRAVENOUS | Status: DC
  Administered 2023-02-26: 10 m[IU]/min via INTRAVENOUS
  Administered 2023-02-26: 2 m[IU]/min via INTRAVENOUS
  Administered 2023-02-26: 8 m[IU]/min via INTRAVENOUS
  Administered 2023-02-26: 6 m[IU]/min via INTRAVENOUS
  Administered 2023-02-26: 4 m[IU]/min via INTRAVENOUS

## 2023-02-26 MED ORDER — EPHEDRINE 5 MG/ML INJ: 10.0000 mg | INTRAVENOUS | Status: DC | PRN

## 2023-02-26 MED ORDER — ONDANSETRON HCL 4 MG/2ML IJ SOLN: 4.0000 mg | Freq: Four times a day (QID) | INTRAMUSCULAR | Status: DC | PRN

## 2023-02-26 MED ORDER — LIDOCAINE HCL (PF) 1 % IJ SOLN
INTRAMUSCULAR | Status: DC | PRN
  Administered 2023-02-26: 3 mL via EPIDURAL
  Administered 2023-02-26: 2 mL via EPIDURAL
  Administered 2023-02-26: 5 mL via EPIDURAL

## 2023-02-26 MED ORDER — ONDANSETRON HCL 4 MG PO TABS: 4.0000 mg | ORAL_TABLET | ORAL | Status: DC | PRN

## 2023-02-26 MED ORDER — LACTATED RINGERS IV SOLN: 500.0000 mL | INTRAVENOUS | Status: DC | PRN

## 2023-02-26 MED ORDER — SIMETHICONE 80 MG PO CHEW: 80.0000 mg | CHEWABLE_TABLET | ORAL | Status: DC | PRN

## 2023-02-26 MED ORDER — COCONUT OIL OIL: 1.0000 | TOPICAL_OIL | Status: DC | PRN

## 2023-02-26 MED ORDER — ACETAMINOPHEN 325 MG PO TABS: 650.0000 mg | ORAL_TABLET | ORAL | Status: DC | PRN

## 2023-02-26 MED ORDER — TERBUTALINE SULFATE 1 MG/ML IJ SOLN: 0.2500 mg | Freq: Once | INTRAMUSCULAR | Status: DC | PRN

## 2023-02-26 MED ORDER — SOD CITRATE-CITRIC ACID 500-334 MG/5ML PO SOLN: 30.0000 mL | ORAL | Status: DC | PRN

## 2023-02-26 MED ORDER — PRENATAL MULTIVITAMIN CH
1.0000 | ORAL_TABLET | Freq: Every day | ORAL | Status: DC
  Administered 2023-02-27 – 2023-02-28 (×2): 1 via ORAL
  Filled 2023-02-26 (×2): qty 1

## 2023-02-26 MED ORDER — LACTATED RINGERS IV SOLN: 500.0000 mL | Freq: Once | INTRAVENOUS | Status: DC

## 2023-02-26 MED ORDER — IBUPROFEN 600 MG PO TABS
600.0000 mg | ORAL_TABLET | Freq: Four times a day (QID) | ORAL | Status: DC
  Administered 2023-02-26 – 2023-02-28 (×7): 600 mg via ORAL
  Filled 2023-02-26 (×8): qty 1

## 2023-02-26 MED ORDER — BENZOCAINE-MENTHOL 20-0.5 % EX AERO: 1.0000 | INHALATION_SPRAY | CUTANEOUS | Status: DC | PRN

## 2023-02-26 MED ORDER — DIPHENHYDRAMINE HCL 25 MG PO CAPS: 25.0000 mg | ORAL_CAPSULE | Freq: Four times a day (QID) | ORAL | Status: DC | PRN

## 2023-02-26 MED ORDER — OXYTOCIN-SODIUM CHLORIDE 30-0.9 UT/500ML-% IV SOLN
2.5000 [IU]/h | INTRAVENOUS | Status: DC
  Administered 2023-02-26: 2.5 [IU]/h via INTRAVENOUS
  Filled 2023-02-26: qty 500

## 2023-02-26 MED ORDER — OXYTOCIN BOLUS FROM INFUSION: 333.0000 mL | Freq: Once | INTRAVENOUS | Status: DC

## 2023-02-26 MED ORDER — PHENYLEPHRINE 80 MCG/ML (10ML) SYRINGE FOR IV PUSH (FOR BLOOD PRESSURE SUPPORT): 80.0000 ug | PREFILLED_SYRINGE | INTRAVENOUS | Status: DC | PRN

## 2023-02-26 MED ORDER — LIDOCAINE HCL (PF) 1 % IJ SOLN: 30.0000 mL | INTRAMUSCULAR | Status: DC | PRN

## 2023-02-26 MED ORDER — WITCH HAZEL-GLYCERIN EX PADS: 1.0000 | MEDICATED_PAD | CUTANEOUS | Status: DC | PRN

## 2023-02-26 MED ORDER — SENNOSIDES-DOCUSATE SODIUM 8.6-50 MG PO TABS
2.0000 | ORAL_TABLET | Freq: Every day | ORAL | Status: DC
  Administered 2023-02-27 – 2023-02-28 (×2): 2 via ORAL
  Filled 2023-02-26 (×2): qty 2

## 2023-02-26 MED ORDER — TETANUS-DIPHTH-ACELL PERTUSSIS 5-2.5-18.5 LF-MCG/0.5 IM SUSY: 0.5000 mL | PREFILLED_SYRINGE | Freq: Once | INTRAMUSCULAR | Status: DC

## 2023-02-26 MED ORDER — FENTANYL CITRATE (PF) 100 MCG/2ML IJ SOLN: 50.0000 ug | INTRAMUSCULAR | Status: DC | PRN

## 2023-02-26 MED ORDER — DIBUCAINE (PERIANAL) 1 % EX OINT: 1.0000 | TOPICAL_OINTMENT | CUTANEOUS | Status: DC | PRN

## 2023-02-26 MED ORDER — ZOLPIDEM TARTRATE 5 MG PO TABS: 5.0000 mg | ORAL_TABLET | Freq: Every evening | ORAL | Status: DC | PRN

## 2023-02-26 MED ORDER — ONDANSETRON HCL 4 MG/2ML IJ SOLN: 4.0000 mg | INTRAMUSCULAR | Status: DC | PRN

## 2023-02-26 MED ORDER — OXYCODONE-ACETAMINOPHEN 5-325 MG PO TABS: 2.0000 | ORAL_TABLET | ORAL | Status: DC | PRN

## 2023-02-26 MED ORDER — DIPHENHYDRAMINE HCL 50 MG/ML IJ SOLN: 12.5000 mg | INTRAMUSCULAR | Status: DC | PRN

## 2023-02-26 MED ORDER — OXYCODONE-ACETAMINOPHEN 5-325 MG PO TABS: 1.0000 | ORAL_TABLET | ORAL | Status: DC | PRN

## 2023-02-26 NOTE — Anesthesia Procedure Notes (Signed)
Epidural Patient location during procedure: OB Start time: 02/26/2023 11:23 AM End time: 02/26/2023 11:30 AM  Staffing Anesthesiologist: Collene Schlichter, MD Performed: anesthesiologist   Preanesthetic Checklist Completed: patient identified, IV checked, risks and benefits discussed, monitors and equipment checked, pre-op evaluation and timeout performed  Epidural Patient position: sitting Prep: DuraPrep Patient monitoring: blood pressure and continuous pulse ox Approach: midline Location: L3-L4 Injection technique: LOR air  Needle:  Needle type: Tuohy  Needle gauge: 17 G Needle length: 9 cm Needle insertion depth: 4 cm Catheter size: 19 Gauge Catheter at skin depth: 9 cm Test dose: negative and Other (1% Lidocaine)  Additional Notes Patient identified.  Risk benefits discussed including failed block, incomplete pain control, headache, nerve damage, paralysis, blood pressure changes, nausea, vomiting, reactions to medication both toxic or allergic, and postpartum back pain.  Patient expressed understanding and wished to proceed.  All questions were answered.  Sterile technique used throughout procedure and epidural site dressed with sterile barrier dressing. No paresthesia or other complications noted. The patient did not experience any signs of intravascular injection such as tinnitus or metallic taste in mouth nor signs of intrathecal spread such as rapid motor block. Please see nursing notes for vital signs. Reason for block:procedure for pain

## 2023-02-26 NOTE — Discharge Summary (Signed)
Postpartum Discharge Summary  Date of Service updated***     Patient Name: Lauren Ellis DOB: [birth year] MRN: [id number]  Date of admission: 02/26/2023 Delivery date:02/26/2023  Delivering provider: Lavonda Jumbo  Date of discharge: 02/26/2023  Admitting diagnosis: Gestational diabetes [O24.419] Intrauterine pregnancy: [redacted]w[redacted]d     Secondary diagnosis:  Principal Problem:   Gestational diabetes Active Problems:   VBAC (vaginal birth after Cesarean)  Additional problems: ***    Discharge diagnosis: {DX.:23714}                                              Post partum procedures:{Postpartum procedures:23558} Augmentation: AROM and Pitocin Complications: None  Hospital course: Induction of Labor With Vaginal Delivery   42 y.o. yo G5P5005 at 100w0d was admitted to the hospital 02/26/2023 for induction of labor.  Indication for induction: A2 DM.  Patient had an labor course that was uncomplicated.  Membrane Rupture Time/Date: 2:03 PM ,02/26/2023   Delivery Method:Vaginal, Spontaneous  Episiotomy: None  Lacerations:    Details of delivery can be found in separate delivery note.  Patient had a postpartum course complicated by***. Patient is discharged home 02/26/23.  Newborn Data: Birth date:02/26/2023  Birth time:3:13 PM  Gender:Female  Living status:Living  Apgars:8 ,9  Weight:3118 g   Magnesium Sulfate received: No BMZ received: No Rhophylac:No MMR:{MMR:30440033} T-DaP:{Tdap:23962} Flu: {WUJ:81191} Transfusion:{Transfusion received:30440034}  Physical exam  Vitals:   02/26/23 1601 02/26/23 1630 02/26/23 1633 02/26/23 1646  BP: (!) 115/59 (!) 99/56 (!) 108/57 109/84  Pulse: 63 63 72 66  Resp:      Temp:      TempSrc:      Weight:      Height:       General: {Exam; general:21111117} Lochia: {Desc; appropriate/inappropriate:30686::"appropriate"} Uterine Fundus: {Desc; firm/soft:30687} Incision: {Exam; incision:21111123} DVT Evaluation: {Exam;  YNW:2956213} Labs: Lab Results  Component Value Date   WBC 7.0 02/26/2023   HGB 11.1 (L) 02/26/2023   HCT 32.5 (L) 02/26/2023   MCV 91.3 02/26/2023   PLT 216 02/26/2023      Latest Ref Rng & Units 02/26/2023    8:50 AM  CMP  Glucose 70 - 99 mg/dL 85   BUN 6 - 20 mg/dL 11   Creatinine 0.86 - 1.00 mg/dL 5.78   Sodium 469 - 629 mmol/L 135   Potassium 3.5 - 5.1 mmol/L 3.7   Chloride 98 - 111 mmol/L 106   CO2 22 - 32 mmol/L 19   Calcium 8.9 - 10.3 mg/dL 8.8   Total Protein 6.5 - 8.1 g/dL 6.0   Total Bilirubin 0.3 - 1.2 mg/dL 0.3   Alkaline Phos 38 - 126 U/L 97   AST 15 - 41 U/L 30   ALT 0 - 44 U/L 18    Edinburgh Score:     No data to display           After visit meds:  Allergies as of 02/26/2023   No Known Allergies   Med Rec must be completed prior to using this [facility]***        Discharge home in stable condition Infant Feeding: {Baby feeding:23562} Infant Disposition:{CHL IP OB HOME WITH BMWUXL:24401} Discharge instruction: per After Visit Summary and Postpartum booklet. Activity: Advance as tolerated. Pelvic rest for 6 weeks.  Diet: {OB UUVO:53664403} Future Appointments:No future appointments. Follow up Visit:  Message sent  to [facility] by Autry-Lott on 02/26/2023  Please schedule this patient for a In person postpartum visit in 6 weeks with the following provider: MD and APP. Additional Postpartum F/U:2 hour GTT  High risk pregnancy complicated by: GDM and AMA, VBAC Delivery mode:  Vaginal, Spontaneous  Anticipated Birth Control:  POPs   02/26/2023 Simone Autry-Lott, DO

## 2023-02-26 NOTE — H&P (Signed)
OBSTETRIC ADMISSION HISTORY AND PHYSICAL  The entire encounter was performed in the presence of a video interpreter  [address] Lauren Ellis is a 42 y.o. female [id number] with IUP at [redacted]w[redacted]d by LMP presenting for IOL. She reports +FMs, No LOF, no VB.  She plans on breast feeding. She request POPs for birth control. She received her prenatal care at  [facility]    Dating: By LMP --->  Estimated Date of Delivery: 03/05/23  Sono:    @[redacted]w[redacted]d , CWD, normal anatomy, cephalic presentation, posterior placental lie, 2921g, 55% EFW   Prenatal History/Complications:  -History of VBAC x3, TOLAC -A2GDM (Metformin 500 mg BID) -Anemia (10.9 4/8) -AMA -Language barrier (Arabic speaking)  Past Medical History: Past Medical History:  Diagnosis Date   Anxiety    Anxiety 08/30/2021   Chronic lower back pain    occasional sciatica on right   Gestational diabetes    Language barrier 07/27/2022   Left breast mass 01/25/2021   Left Breast Mammogram/US (03/2021):  FINDINGS:  There are no masses, areas of architectural distortion, areas of significant asymmetry or suspicious calcifications.     Targeted ultrasound is performed, showing an area normal tissue, with a somewhat prominent fat lobule, in the left breast at 8 o'clock, 9 cm the nipple, corresponding to the area of abnormality. No defined mass or cyst. No suspiciou   Palpitations    Scoliosis    Scoliosis of lumbar spine 08/30/2021   Thoracic disc herniation     Past Surgical History: Past Surgical History:  Procedure Laterality Date   CESAREAN SECTION      Obstetrical History: OB History     Gravida  5   Para  4   Term  4   Preterm      AB      Living  4      SAB      IAB      Ectopic      Multiple      Live Births  4           Social History Social History   Socioeconomic History   Marital status: Married    Spouse name: Lauren Ellis Financial controller   Number of children: 4   Years of education: Not on file   Highest  education level: 3rd grade  Occupational History   Occupation: stay at home mom  Tobacco Use   Smoking status: Never   Smokeless tobacco: Never  Vaping Use   Vaping Use: Never used  Substance and Sexual Activity   Alcohol use: Never   Drug use: Never   Sexual activity: Yes    Birth control/protection: Condom  Other Topics Concern   Not on file  Social History Narrative   Here with husband and 4 children   Social Determinants of Health   Financial Resource Strain: Not on file  Food Insecurity: Food Insecurity Present (07/27/2022)   Hunger Vital Sign    Worried About Running Out of Food in the Last Year: Never true    Ran Out of Food in the Last Year: Sometimes true  Transportation Needs: No Transportation Needs (07/27/2022)   PRAPARE - Administrator, Civil Service (Medical): No    Lack of Transportation (Non-Medical): No  Physical Activity: Not on file  Stress: Not on file  Social Connections: Not on file    Family History: No family history on file.  Allergies: No Known Allergies  Medications Prior to Admission  Medication Sig Dispense  Refill Last Dose   Accu-Chek Softclix Lancets lancets Use as instructed 100 each 12    Blood Glucose Monitoring Suppl (ACCU-CHEK GUIDE) w/Device KIT 1 Device by Does not apply route 4 (four) times daily. 1 kit 0    Blood Pressure Monitoring (BLOOD PRESSURE KIT) DEVI 1 Device by Does not apply route as needed. 1 each 0    famotidine (PEPCID) 20 MG tablet Take 1 tablet (20 mg total) by mouth 2 (two) times daily. 60 tablet 3    glucose blood (ACCU-CHEK GUIDE) test strip Use to check blood sugars four times a day was instructed 50 each 12    metFORMIN (GLUCOPHAGE) 500 MG tablet Take 1 tablet (500 mg total) by mouth 2 (two) times daily with a meal. 60 tablet 5    Misc. Devices (GOJJI WEIGHT SCALE) MISC 1 Device by Does not apply route as needed. 1 each 0    pantoprazole (PROTONIX) 40 MG tablet Take 1 tablet (40 mg total) by mouth  2 (two) times daily before a meal. 60 tablet 2    prenatal vitamin w/FE, FA (PRENATAL 1 + 1) 27-1 MG TABS tablet Take 1 tablet by mouth daily at 12 noon. 30 tablet 11    terconazole (TERAZOL 7) 0.4 % vaginal cream Place 1 applicator vaginally at bedtime. (Patient not taking: Reported on 02/14/2023) 45 g 0      Review of Systems   All systems reviewed and negative except as stated in HPI  Last menstrual period 05/29/2022, unknown if currently breastfeeding. General appearance: alert and no distress Lungs: normal effort Heart: regular rate noted Abdomen: gravid Extremities: No LE edema Presentation: cephalic Fetal monitoringBaseline: 130 bpm, Variability: Good {> 6 bpm), Accelerations: Reactive, and Decelerations: Absent Uterine activity irritability     Prenatal labs: ABO, Rh: A/Positive/-- (10/19 1144) Antibody: Negative (10/19 1144) Rubella: 28.60 (10/19 1144) RPR: Non Reactive (04/08 0857)  HBsAg: Negative (10/19 1144)  HIV: Non Reactive (04/08 0857)  GBS: Negative/-- (05/08 0930)  1 hr Glucola 171 Genetic screening  LR, declined afp Anatomy US WNL  Prenatal Transfer Tool  Maternal Diabetes: Yes:  Diabetes Type:  Insulin/Medication controlled Genetic Screening: Normal Maternal Ultrasounds/Referrals: Normal Fetal Ultrasounds or other Referrals:  None Maternal Substance Abuse:  No Significant Maternal Medications:  Meds include: Protonix Significant Maternal Lab Results:  Group B Strep negative Number of Prenatal Visits:greater than 3 verified prenatal visits Other Comments:  None  No results found for this or any previous visit (from the past 24 hour(s)).  Patient Active Problem List   Diagnosis Date Noted   GDM (gestational diabetes mellitus) 01/16/2023   Anemia in pregnancy, third trimester 01/16/2023   History of VBAC 11/21/2022   AMA (advanced maternal age) multigravida 40+ 07/27/2022   Supervision of high risk pregnancy, antepartum 07/27/2022   History of  C-section 07/27/2022   Language barrier 07/27/2022   Prediabetes 01/29/2022    Assessment/Plan:  Lauren Ellis is a 42 y.o. [id number] at [redacted]w[redacted]d here for IOL, TOLAC 2/2 A2GDM  #Labor: Start pit 2 by 2. Will assess for AROM at next cervical exam.  #Pain: Desires Epidural #FWB: Cat I  #ID: GBS neg #MOF: Breast #MOC: POPs #Circ: n/a, girl  History of VBAC x3, TOLAC -Avoid cytotec  A2GDM (Metformin 500 mg BID) -CBG q4h in latent labor, q2h in active labor -CBG goal 90-120  Anemia (10.9 4/8) -F/u admission CBC   Saim Almanza Autry-Lott, DO  02/26/2023, 8:16 AM

## 2023-02-26 NOTE — Lactation Note (Signed)
This note was copied from a baby's chart. Lactation Consultation Note  Patient Name: Lauren Ellis [id number] Date: 02/26/2023 Age:42 hours Reason for consult: Initial assessment;Term;Maternal endocrine disorder Experienced BF mom BF each of her children for 2 yrs each. Mom doesn't have any questions or concerns at this time. Mom stated baby was under the warmer at this time because she was cold. Reminded mom of newborn feeding. Mom encouraged to feed baby 8-12 times/24 hours and with feeding cues.  Encouraged mom if she needs assistance or has any questions to call.  Maternal Data Does the patient have breastfeeding experience prior to this delivery?: Yes How long did the patient breastfeed?: 10,8,5,40 yrs old BF each for 2 yrs  Feeding    LATCH Score                    Lactation Tools Discussed/Used    Interventions    Discharge    Consult Status Consult Status: Complete    Dontay Harm G 02/26/2023, 8:54 PM

## 2023-02-26 NOTE — Anesthesia Preprocedure Evaluation (Signed)
Anesthesia Evaluation  Patient identified by MRN, date of birth, ID band Patient awake    Reviewed: Allergy & Precautions, NPO status , Patient's Chart, lab work & pertinent test results  Airway Mallampati: I  TM Distance: >3 FB Neck ROM: Full    Dental  (+) Teeth Intact, Dental Advisory Given   Pulmonary neg pulmonary ROS   Pulmonary exam normal breath sounds clear to auscultation       Cardiovascular negative cardio ROS Normal cardiovascular exam Rhythm:Regular Rate:Normal     Neuro/Psych negative neurological ROS     GI/Hepatic Neg liver ROS,GERD  Medicated,,  Endo/Other  diabetes, Gestational, Oral Hypoglycemic Agents    Renal/GU negative Renal ROS     Musculoskeletal negative musculoskeletal ROS (+)    Abdominal   Peds  Hematology  (+) Blood dyscrasia, anemia Plt 216k   Anesthesia Other Findings Day of surgery medications reviewed with the patient.  Reproductive/Obstetrics (+) Pregnancy                             Anesthesia Physical Anesthesia Plan  ASA: 3  Anesthesia Plan: Epidural   Post-op Pain Management:    Induction:   PONV Risk Score and Plan: 2 and Treatment may vary due to age or medical condition  Airway Management Planned: Natural Airway  Additional Equipment:   Intra-op Plan:   Post-operative Plan:   Informed Consent: I have reviewed the patients History and Physical, chart, labs and discussed the procedure including the risks, benefits and alternatives for the proposed anesthesia with the patient or authorized representative who has indicated his/her understanding and acceptance.     Dental advisory given  Plan Discussed with:   Anesthesia Plan Comments: (Patient identified. Risks/Benefits/Options discussed with patient including but not limited to bleeding, infection, nerve damage, paralysis, failed block, incomplete pain control, headache, blood  pressure changes, nausea, vomiting, reactions to medication both or allergic, itching and postpartum back pain. Confirmed with bedside nurse the patient's most recent platelet count. Confirmed with patient that they are not currently taking any anticoagulation, have any bleeding history or any family history of bleeding disorders. Patient expressed understanding and wished to proceed. All questions were answered. )       Anesthesia Quick Evaluation

## 2023-02-26 NOTE — Progress Notes (Addendum)
Labor Progress Note Lauren Ellis is a 42 y.o. [id number] at [redacted]w[redacted]d presented for IOL.   S: No acute concerns.   O:  BP 107/61   Pulse 62   Temp 98.3 F (36.8 C) (Oral)   Resp 16   Ht 5' 8.9" (1.75 m)   Wt 80.3 kg   LMP 05/29/2022 (Within Days)   BMI 26.22 kg/m  EFM: 125bpm/moderate/+accels, no decels  CVE: Dilation: 5 Effacement (%): 80 Station: 0 Presentation: Vertex Exam by:: Concha Se RN   A&P: 42 y.o. [id number] [redacted]w[redacted]d here for IOL, TOLAC 2/2 A2GDM.  #Labor: Progressing well. S/p AROM, scant clear fluid. On 10 of pitocin. Continue current management.  #Pain: Epidural #FWB: Cat I  #GBS negative  History of VBAC x3, TOLAC -Avoid cytotec -Monitor for S&S of uterine rupture   A2GDM  -CBG q4h in latent labor, q2h in active labor -CBG goal 90-120    [name], DO 2:02 PM

## 2023-02-27 ENCOUNTER — Encounter (HOSPITAL_COMMUNITY): Payer: Self-pay | Admitting: Obstetrics and Gynecology

## 2023-02-27 LAB — GLUCOSE, CAPILLARY
Glucose-Capillary: 29 mg/dL — CL (ref 70–99)
Glucose-Capillary: 96 mg/dL (ref 70–99)
Glucose-Capillary: 120 mg/dL — ABNORMAL HIGH (ref 70–99)

## 2023-02-27 NOTE — Progress Notes (Signed)
Post Partum Day 1 Subjective: Lauren Ellis is doing well this morning. She is without complaints. She reports RN checked CBG this morning and it was low. She was given juice and CBG increased. She reports she was asymptomatic other than feeling "really tired". She is currently asymptomatic. She is up ad lib, voiding, tolerating PO, and + flatus  Objective: Blood pressure 113/73, pulse 68, temperature 97.9 F (36.6 C), temperature source Oral, resp. rate 17, height 5' 8.9" (1.75 m), weight 80.3 kg, last menstrual period 05/29/2022, SpO2 100 %, unknown if currently breastfeeding.  Physical Exam:  General: alert and no distress Lochia: appropriate Uterine Fundus: firm Incision: n/a DVT Evaluation: No evidence of DVT seen on physical exam.  Recent Labs    02/26/23 0850  HGB 11.1*  HCT 32.5*    Assessment/Plan: Social work consult, patient needs car seat Breastfeeding Discharge home tomorrow   LOS: 1 day   Brand Males, CNM 02/27/2023, 7:09 AM

## 2023-02-27 NOTE — Anesthesia Postprocedure Evaluation (Signed)
Anesthesia Post Note  Patient: [facility]  Procedure(s) Performed: AN AD HOC LABOR EPIDURAL     Patient location during evaluation: Mother Baby Anesthesia Type: Epidural Level of consciousness: awake, awake and alert and oriented Pain management: satisfactory to patient Vital Signs Assessment: post-procedure vital signs reviewed and stable Respiratory status: spontaneous breathing, nonlabored ventilation and respiratory function stable Cardiovascular status: blood pressure returned to baseline and stable Postop Assessment: no headache, no backache, no apparent nausea or vomiting, able to ambulate and adequate PO intake Anesthetic complications: no   No notable events documented.  Last Vitals:  Vitals:   02/27/23 0206 02/27/23 0516  BP: (!) 109/51 113/73  Pulse: (!) 56 68  Resp: 18 17  Temp: 36.7 C 36.6 C  SpO2: 100% 100%    Last Pain:  Vitals:   02/27/23 0757  TempSrc:   PainSc: 0-No pain   Pain Goal:                   Lauren Ellis

## 2023-02-27 NOTE — Inpatient Diabetes Management (Signed)
Inpatient Diabetes Program Recommendations  AACE/ADA: New Consensus Statement on Inpatient Glycemic Control (2015)  Target Ranges:  Prepandial:   less than 140 mg/dL      Peak postprandial:   less than 180 mg/dL (1-2 hours)      Critically ill patients:  140 - 180 mg/dL   Lab Results  Component Value Date   GLUCAP 96 02/27/2023   HGBA1C 5.6 07/27/2022    Review of Glycemic Control  Latest Reference Range & Units 02/27/23 05:27 02/27/23 05:58  Glucose-Capillary 70 - 99 mg/dL 29 (LL) 96  (LL): Data is critically low Diabetes history: GDM, PreDM Outpatient Diabetes medications: Metformin 500 mg BID Current orders for Inpatient glycemic control: none  Inpatient Diabetes Program Recommendations:    Noted hypoglycemia this AM of 29 mg/dL and associated interventions. Recommend adding CBGs TID & HS.   Thanks, Lujean Rave, MSN, RNC-OB Diabetes Coordinator [phone] (8a-5p)

## 2023-02-28 ENCOUNTER — Ambulatory Visit: Payer: Medicaid Other

## 2023-02-28 LAB — GLUCOSE, CAPILLARY: Glucose-Capillary: 95 mg/dL (ref 70–99)

## 2023-02-28 MED ORDER — IBUPROFEN 600 MG PO TABS: 600.0000 mg | ORAL_TABLET | Freq: Three times a day (TID) | ORAL | 0 refills | Status: DC | PRN

## 2023-02-28 MED ORDER — NORETHINDRONE 0.35 MG PO TABS: 1.0000 | ORAL_TABLET | Freq: Every day | ORAL | 11 refills | Status: DC

## 2023-02-28 NOTE — Social Work (Signed)
CSW received consult for hx of Anxiety and MOB needing a car seat.  CSW met with MOB to offer support and complete assessment.  CSW entered the room arranged [employer] language services interpreter [place] [phone]. CSW introduced self, CSW role and reason for visit. MOB was agreeable to visit. CSW inquired about how MOB was feeling, MOB reported feeling good. CSW inquired about MOB MH hx, MOB reported she experienced anxiety "a long time ago". MOB reported a stable mood and no current MH concerns. CSW provided education regarding the baby blues period vs. perinatal mood disorders, discussed treatment and gave resources for mental health follow up if concerns arise.  CSW recommends self-evaluation during the postpartum time period using the New Mom Checklist from Postpartum Progress and encouraged MOB to contact a medical professional if symptoms are noted at any time.  MOB identified her husband as her support.  CSW provided review of Sudden Infant Death Syndrome (SIDS) precautions.  MOB reported she did not have a car seat for the infant. CSW inquired if MOB had other necessary items such as diaper, wipes and a crib or bassinet for the infant to sleep. MOB reported no. CSW notified MOB a ca seat and Pack n play could be provided and a referral for diapers and wipes could be made through [employer]. MOB was agreeable to resources. CSW provided a car seat and pack n play and referral was made to [facility] connect.  CSW identifies no further need for intervention and no barriers to discharge at this time.  Wende Neighbors, LCSWA Clinical Social Worker [phone]

## 2023-03-06 ENCOUNTER — Telehealth (HOSPITAL_COMMUNITY): Payer: Self-pay | Admitting: *Deleted

## 2023-03-06 ENCOUNTER — Telehealth: Payer: Self-pay | Admitting: Lactation Services

## 2023-03-06 ENCOUNTER — Encounter: Payer: Self-pay | Admitting: Lactation Services

## 2023-03-06 NOTE — Telephone Encounter (Signed)
-----   Message from Beverly M Daly, RN sent at 03/02/2023  8:04 PM EDT ----- Regarding: Lactation Appointment Contact: [phone] [facility] doctor called and left message on lactation phone requesting an LC OP appointment for this mother. Patient's primary language is Arabic. When I called the home, she spoke some English  and was able to understand that you would be calling her (probably Tuesday after Memorial Day) to schedule an OP Lactation appointment. She said that was fine, she just needs help latching baby.  

## 2023-03-06 NOTE — Telephone Encounter (Signed)
Spoke to patient and she declined an appointment at this time.

## 2023-03-06 NOTE — Telephone Encounter (Signed)
Mom reports feeling good. No concerns about herself at this time. EPDS declined and reports repeated in MD office with score of 0. [facility] score=0) Mom reports baby is doing well. Feeding, peeing, and pooping without difficulty. Safe sleep reviewed. Mom reports no concerns about baby at present.  Duffy Rhody, RN 03-06-2023 at 12:34pm

## 2023-03-06 NOTE — Telephone Encounter (Signed)
Opened in error

## 2023-03-06 NOTE — Telephone Encounter (Signed)
-----   Message from Omar Person, RN sent at 03/02/2023  8:04 PM EDT ----- Regarding: Lactation Appointment Contact: [phone] [facility] doctor called and left message on lactation phone requesting an LC OP appointment for this mother. Patient's primary language is Arabic. When I called the home, she spoke some English  and was able to understand that you would be calling her (probably Tuesday after Memorial Day) to schedule an OP Lactation appointment. She said that was fine, she just needs help latching baby.

## 2023-04-09 ENCOUNTER — Ambulatory Visit: Payer: Medicaid Other | Admitting: Family Medicine

## 2023-04-30 ENCOUNTER — Encounter: Payer: Self-pay | Admitting: Obstetrics and Gynecology

## 2023-04-30 ENCOUNTER — Other Ambulatory Visit: Payer: Self-pay

## 2023-04-30 ENCOUNTER — Ambulatory Visit (INDEPENDENT_AMBULATORY_CARE_PROVIDER_SITE_OTHER): Payer: Medicaid Other | Admitting: Obstetrics and Gynecology

## 2023-04-30 DIAGNOSIS — O24419 Gestational diabetes mellitus in pregnancy, unspecified control: Secondary | ICD-10-CM

## 2023-04-30 NOTE — Progress Notes (Signed)
    Post Partum Visit Note  [facility] Lauren Ellis is a 42 y.o. [id number] female who presents for a postpartum visit. She is 9 weeks postpartum following a normal spontaneous vaginal delivery.  I have fully reviewed the prenatal and intrapartum course. The delivery was at 39 gestational weeks.  Anesthesia: epidural. Postpartum course has been unremarkable. Baby is doing well. Baby is feeding by breast. Bleeding no bleeding. Bowel function is abnormal: constipation . Bladder function is normal. Patient is sexually active. Contraception method is oral progesterone-only contraceptive. Postpartum depression screening: negative.   The pregnancy intention screening data noted above was reviewed. Potential methods of contraception were discussed. The patient elected to proceed with No data recorded.    Health Maintenance Due  Topic Date Due   PAP SMEAR-Modifier  Never done   COVID-19 Vaccine (3 - 2023-24 season) 06/09/2022    Medical records  Review of Systems Pertinent items noted in HPI and remainder of comprehensive ROS otherwise negative.  Objective:  LMP 05/29/2022 (Within Days)    General:  alert   Breasts:  not indicated  Lungs: clear to auscultation bilaterally  Heart:  regular rate and rhythm, S1, S2 normal, no murmur, click, rub or gallop  Abdomen: soft, non-tender; bowel sounds normal; no masses,  no organomegaly   Wound NA  GU exam:  not indicated       Assessment:    1. Postpartum care following vaginal delivery NL  2. Gestational diabetes mellitus (GDM), antepartum, gestational diabetes method of control unspecified To schedule   NL postpartum exam.   Plan:   Essential components of care per ACOG recommendations:  1.  Mood and well being: Patient with negative depression screening today. Reviewed local resources for support.  - Patient tobacco use? No.   - hx of drug use? No.    2. Infant care and feeding:  -Patient currently breastmilk feeding?   -Social determinants of health (SDOH) reviewed in EPIC. No concerns  3. Sexuality, contraception and birth spacing - Patient does not know want a pregnancy in the next year.  Desired family size is uncertain   - Reviewed reproductive life planning. Reviewed contraceptive methods based on pt preferences and effectiveness.  Patient desired Oral Contraceptive today.   - Discussed birth spacing of 18 months  4. Sleep and fatigue -Encouraged family/partner/community support of 4 hrs of uninterrupted sleep to help with mood and fatigue  5. Physical Recovery  - Discussed patients delivery and complications. She describes her labor as good. - Patient had a Vaginal, no problems at delivery. Patient had a 1st degree laceration. Perineal healing reviewed. Patient expressed understanding - Patient has urinary incontinence? No. - Patient is safe to resume physical and sexual activity  6.  Health Maintenance - HM due items addressed Yes - Last pap smear No results found for: "DIAGPAP" Pap smear not done at today's visit.  -Breast Cancer screening indicated? No.   7. Chronic Disease/Pregnancy Condition follow up: None  Pt to schedule appt for pap smear and Glucola  Nettie Elm, MD [facility], [facility]

## 2023-05-08 ENCOUNTER — Other Ambulatory Visit: Payer: Medicaid Other

## 2023-05-08 DIAGNOSIS — O24419 Gestational diabetes mellitus in pregnancy, unspecified control: Secondary | ICD-10-CM

## 2024-06-03 ENCOUNTER — Encounter

## 2024-06-03 ENCOUNTER — Ambulatory Visit

## 2024-06-03 ENCOUNTER — Ambulatory Visit: Attending: Cardiology | Admitting: Cardiology

## 2024-06-03 ENCOUNTER — Encounter: Payer: Self-pay | Admitting: Cardiology

## 2024-06-03 VITALS — BP 98/65 | HR 59 | Ht 70.0 in | Wt 171.0 lb

## 2024-06-03 DIAGNOSIS — R072 Precordial pain: Secondary | ICD-10-CM | POA: Diagnosis present

## 2024-06-03 DIAGNOSIS — R079 Chest pain, unspecified: Secondary | ICD-10-CM | POA: Diagnosis not present

## 2024-06-03 DIAGNOSIS — R002 Palpitations: Secondary | ICD-10-CM

## 2024-06-03 DIAGNOSIS — R0609 Other forms of dyspnea: Secondary | ICD-10-CM | POA: Diagnosis not present

## 2024-06-03 NOTE — Progress Notes (Unsigned)
 Applied a 14 day Zio XT monitor to patient in the office ?

## 2024-06-03 NOTE — Patient Instructions (Addendum)
 Testing/Procedures: Calcium score  CT scanning for a cardiac calcium score (CAT scanning), is a noninvasive, special x-ray that produces cross-sectional images of the body using x-rays and a computer. CT scans help physicians diagnose and treat medical conditions. For some CT exams, a contrast material is used to enhance visibility in the area of the body being studied. CT scans provide greater clarity and reveal more details than regular x-ray exams. Your physician has requested that you have a coronary calcium score performed. This is not covered by insurance and will be an out-of-pocket cost of approximately $99.  Echocardiogram  Your physician has requested that you have an echocardiogram. Echocardiography is a painless test that uses sound waves to create images of your heart. It provides your doctor with information about the size and shape of your heart and how well your heart's chambers and valves are working. This procedure takes approximately one hour. There are no restrictions for this procedure. Please do NOT wear cologne, perfume, aftershave, or lotions (deodorant is allowed). Please arrive 15 minutes prior to your appointment time.  Please note: We ask at that you not bring children with you during ultrasound (echo/ vascular) testing. Due to room size and safety concerns, children are not allowed in the ultrasound rooms during exams. Our front office staff cannot provide observation of children in our lobby area while testing is being conducted. An adult accompanying a patient to their appointment will only be allowed in the ultrasound room at the discretion of the ultrasound technician under special circumstances. We apologize for any inconvenience.  GXT- Exercise stress test   Exercise Tolerance Test  Please arrive 15 minutes prior to your appointment time for registration and insurance purposes.  The test will take approximately 45 minutes to complete.  How to prepare for  your Exercise Stress Test: Do bring a list of your current medications with you.  If not listed below, you may take your medications as normal. Do wear comfortable clothes (no dresses or overalls) and walking shoes, tennis shoes preferred (no heels or open toed shoes are allowed) Do Not wear cologne, perfume, aftershave or lotions (deodorant is allowed).  If these instructions are not followed, your test will have to be rescheduled.  If you have questions or concerns about your appointment, you can call the Stress Lab at [phone].  If you cannot keep your appointment, please provide 24 hours notification to the Stress Lab, to avoid a possible $50 charge to your account.  ZIO PATCH  Your physician has requested that you wear a Zio heart monitor for __14___ days. This will be mailed to your home with instructions on how to apply the monitor and how to return it when finished. Please allow 2 weeks after returning the heart monitor before our office calls you with the results.   Follow-Up: At [facility], you and your health needs are our priority.  As part of our continuing mission to provide you with exceptional heart care, our providers are all part of one team.  This team includes your primary Cardiologist (physician) and Advanced Practice Providers or APPs (Physician Assistants and Nurse Practitioners) who all work together to provide you with the care you need, when you need it.  Your next appointment:   3 month(s)  Provider:   One of our Advanced Practice Providers (APPs): Morse Clause, PA-C  Lamarr Satterfield, NP Miriam Shams, NP  Olivia Pavy, PA-C Josefa Beauvais, NP  Leontine Salen, PA-C Orren Fabry, PA-C  Hao Meng,  PA-C Jackee Alberts, NP  Damien Braver, NP Jon Hails, PA-C  Waddell Donath, PA-C    Dayna Dunn, PA-C  Glendia Ferrier, PA-C Lum Louis, NP Katlyn West, NP Callie Goodrich, PA-C  Evan Williams, PA-C Sheng Haley, PA-C  Xika Zhao, NP Kathleen Johnson,  PA-C

## 2024-06-03 NOTE — Progress Notes (Signed)
  Cardiology Office Note:  .   Date:  06/03/2024  ID:  Lauren Ellis Lauren Ellis, [place] 1981/09/25, MRN [id number] PCP: Alba Sharper, MD  [facility] Providers Cardiologist:  Newman Lawrence, MD PCP: Alba Sharper, MD  Chief Complaint  Patient presents with   Palpitations   Chest Pain     [facility] Knepp is a 43 y.o. female with palpitations, chest pain  Discussed the use of AI scribe software for clinical note transcription with the patient, who gave verbal consent to proceed.  History of Present Illness Lauren Ellis is a 43 year old female who presents with episodes of palpitations and shortness of breath.  She experiences episodes of rapid heartbeats occurring two to three times daily, lasting approximately two minutes, resolving spontaneously. Shortness of breath occurs with deep breaths, accompanied by anterior and posterior chest discomfort, lasting three to four minutes, and has persisted for several months. There is no associated lightheadedness or syncope. She occasionally has a dry cough. There is no personal or family history of heart disease. She does not use tobacco or consume coffee or tea.    Vitals:   06/03/24 0939  BP: 98/65  Pulse: (!) 59  SpO2: 99%      Review of Systems  Cardiovascular:  Positive for chest pain and palpitations. Negative for dyspnea on exertion, leg swelling and syncope.        Studies Reviewed: SABRA        EKG 06/03/2024: Sinus rhythm 56 bpm When compared with ECG of 07-Mar-2021 15:06, No significant change was found    Physical Exam Vitals and nursing note reviewed.  Constitutional:      General: She is not in acute distress. Neck:     Vascular: No JVD.  Cardiovascular:     Rate and Rhythm: Normal rate and regular rhythm.     Heart sounds: Normal heart sounds. No murmur heard. Pulmonary:     Effort: Pulmonary effort is normal.     Breath sounds: Normal breath sounds. No  wheezing or rales.  Musculoskeletal:     Right lower leg: No edema.     Left lower leg: No edema.      VISIT DIAGNOSES:   ICD-10-CM   1. Palpitations  R00.2 EKG 12-Lead    2. Chest pain of uncertain etiology  R07.9 EKG 12-Lead       [facility] [name] is a 43 y.o. female with patient's, chest pain  Assessment & Plan Chest pain, palpitations: Chest pain more pleuritic than cardiac in etiology.  No significant CAD risk factors.   Recommend exercise treadmill stress test, echocardiogram, CT cardiac scoring scan for restratification, along with 2-week ZIO monitor.  Discussed vagal maneuvers.    Informed Consent   Shared Decision Making/Informed Consent The risks [chest pain, shortness of breath, cardiac arrhythmias, dizziness, blood pressure fluctuations, myocardial infarction, stroke/transient ischemic attack, and life-threatening complications (estimated to be 1 in 10,000)], benefits (risk stratification, diagnosing coronary artery disease, treatment guidance) and alternatives of an exercise tolerance test were discussed in detail with [name] and she agrees to proceed.      F/u in 3 months  Signed, Newman JINNY Lawrence, MD

## 2024-06-16 ENCOUNTER — Ambulatory Visit (HOSPITAL_COMMUNITY)

## 2024-06-20 ENCOUNTER — Ambulatory Visit: Payer: Self-pay | Admitting: Cardiology

## 2024-06-20 DIAGNOSIS — R002 Palpitations: Secondary | ICD-10-CM | POA: Diagnosis not present

## 2024-06-20 NOTE — Progress Notes (Signed)
 Needs Arabic interpreter: No significant heart rhythm abnormalities noted. One short episode of rapid heart beat without any symptoms. No new medications needed.  Thanks MJP

## 2024-06-24 NOTE — Progress Notes (Signed)
 Left message to call back for results using [employer] Interpretor System.

## 2024-06-26 NOTE — Telephone Encounter (Signed)
 Pt returning call and requesting call back. Please advise

## 2024-07-02 NOTE — Progress Notes (Signed)
 Left message to call back for results using [employer] Interpretor System.

## 2024-07-04 ENCOUNTER — Encounter (HOSPITAL_COMMUNITY): Payer: Self-pay | Admitting: *Deleted

## 2024-07-07 ENCOUNTER — Ambulatory Visit (HOSPITAL_COMMUNITY): Admission: RE | Admit: 2024-07-07 | Source: Ambulatory Visit

## 2024-07-07 ENCOUNTER — Encounter (HOSPITAL_COMMUNITY): Payer: Self-pay | Admitting: Cardiology

## 2024-07-10 ENCOUNTER — Other Ambulatory Visit: Payer: Self-pay

## 2024-07-10 DIAGNOSIS — R072 Precordial pain: Secondary | ICD-10-CM

## 2024-07-10 NOTE — Progress Notes (Unsigned)
 Attestation needed for ETT. Please sign.

## 2024-07-11 ENCOUNTER — Encounter (HOSPITAL_COMMUNITY): Payer: Self-pay | Admitting: Cardiology

## 2024-07-11 ENCOUNTER — Ambulatory Visit (HOSPITAL_COMMUNITY): Attending: Cardiology

## 2024-07-14 ENCOUNTER — Other Ambulatory Visit: Payer: Self-pay | Admitting: Physician Assistant

## 2024-07-14 DIAGNOSIS — N898 Other specified noninflammatory disorders of vagina: Secondary | ICD-10-CM | POA: Insufficient documentation

## 2024-07-14 DIAGNOSIS — N946 Dysmenorrhea, unspecified: Secondary | ICD-10-CM

## 2024-07-14 DIAGNOSIS — Z3200 Encounter for pregnancy test, result unknown: Secondary | ICD-10-CM | POA: Insufficient documentation

## 2024-07-14 DIAGNOSIS — Z0142 Encounter for cervical smear to confirm findings of recent normal smear following initial abnormal smear: Secondary | ICD-10-CM | POA: Insufficient documentation

## 2024-07-16 ENCOUNTER — Ambulatory Visit
Admission: RE | Admit: 2024-07-16 | Discharge: 2024-07-16 | Disposition: A | Discharge status: Home / Self Care | Source: Ambulatory Visit | Attending: Physician Assistant | Admitting: Physician Assistant

## 2024-07-16 DIAGNOSIS — N946 Dysmenorrhea, unspecified: Secondary | ICD-10-CM

## 2024-07-17 DIAGNOSIS — B3731 Acute candidiasis of vulva and vagina: Secondary | ICD-10-CM | POA: Insufficient documentation

## 2024-08-25 ENCOUNTER — Inpatient Hospital Stay (HOSPITAL_COMMUNITY)

## 2024-08-25 ENCOUNTER — Encounter (HOSPITAL_COMMUNITY): Payer: Self-pay | Admitting: Obstetrics and Gynecology

## 2024-08-25 ENCOUNTER — Inpatient Hospital Stay (HOSPITAL_COMMUNITY)
Admission: AD | Admit: 2024-08-25 | Discharge: 2024-08-25 | Disposition: A | Discharge status: Home / Self Care | Attending: Obstetrics and Gynecology | Admitting: Obstetrics and Gynecology

## 2024-08-25 ENCOUNTER — Other Ambulatory Visit: Payer: Self-pay

## 2024-08-25 DIAGNOSIS — R109 Unspecified abdominal pain: Secondary | ICD-10-CM | POA: Diagnosis present

## 2024-08-25 DIAGNOSIS — Z3201 Encounter for pregnancy test, result positive: Secondary | ICD-10-CM | POA: Insufficient documentation

## 2024-08-25 DIAGNOSIS — Z3A01 Less than 8 weeks gestation of pregnancy: Secondary | ICD-10-CM

## 2024-08-25 DIAGNOSIS — O3680X0 Pregnancy with inconclusive fetal viability, not applicable or unspecified: Secondary | ICD-10-CM | POA: Insufficient documentation

## 2024-08-25 DIAGNOSIS — Z113 Encounter for screening for infections with a predominantly sexual mode of transmission: Secondary | ICD-10-CM | POA: Diagnosis present

## 2024-08-25 DIAGNOSIS — B9689 Other specified bacterial agents as the cause of diseases classified elsewhere: Secondary | ICD-10-CM | POA: Diagnosis not present

## 2024-08-25 DIAGNOSIS — O23591 Infection of other part of genital tract in pregnancy, first trimester: Secondary | ICD-10-CM | POA: Diagnosis not present

## 2024-08-25 LAB — URINALYSIS, ROUTINE W REFLEX MICROSCOPIC
Bilirubin Urine: NEGATIVE
Glucose, UA: NEGATIVE mg/dL
Ketones, ur: NEGATIVE mg/dL
Nitrite: NEGATIVE
Protein, ur: NEGATIVE mg/dL
Specific Gravity, Urine: <1.005 — ABNORMAL LOW (ref 1.005–1.030)
pH: 6 (ref 5.0–8.0)

## 2024-08-25 LAB — CBC
HCT: 35.8 % — ABNORMAL LOW (ref 36.0–46.0)
Hemoglobin: 12.3 g/dL (ref 12.0–15.0)
MCH: 30.6 pg (ref 26.0–34.0)
MCHC: 34.4 g/dL (ref 30.0–36.0)
MCV: 89.1 fL (ref 80.0–100.0)
Platelets: 254 K/uL (ref 150–400)
RBC: 4.02 MIL/uL (ref 3.87–5.11)
RDW: 13.3 % (ref 11.5–15.5)
WBC: 5.5 K/uL (ref 4.0–10.5)
nRBC: 0 % (ref 0.0–0.2)

## 2024-08-25 LAB — WET PREP, GENITAL
Sperm: NONE SEEN
Trich, Wet Prep: NONE SEEN
WBC, Wet Prep HPF POC: <10 (ref <10)
Yeast Wet Prep HPF POC: NONE SEEN

## 2024-08-25 LAB — URINALYSIS, MICROSCOPIC (REFLEX)

## 2024-08-25 LAB — POCT PREGNANCY, URINE: Preg Test, Ur: POSITIVE — AB

## 2024-08-25 LAB — HCG, QUANTITATIVE, PREGNANCY: hCG, Beta Chain, Quant, S: 9934 m[IU]/mL — ABNORMAL HIGH (ref <5)

## 2024-08-25 MED ORDER — METRONIDAZOLE 500 MG PO TABS: 500.0000 mg | ORAL_TABLET | Freq: Two times a day (BID) | ORAL | 0 refills | Status: AC

## 2024-08-25 NOTE — MAU Note (Signed)
 Lauren Ellis is a 43 y.o. at Unknown here in MAU reporting: her LMP was 07/18/2024, has a negative home pregnancy test but has been dizzy.  Denies VB, but has abdominal cramping similar to when period coming.  LMP: 07/18/2024 Onset of complaint: 2 weeks ago Pain score: 6 Vitals:   08/25/24 1120  BP: 113/65  Pulse: 72  Resp: 19  Temp: 98.1 F (36.7 C)  SpO2: 100%     FHT: NA  Lab orders placed from triage: UPT

## 2024-08-25 NOTE — MAU Provider Note (Signed)
 None     S [name] is a 43 y.o. G41P5005 female at [redacted]w[redacted]d who presents to MAU today with complaint of dizziness and abdominal cramping. She reports she had a negative home pregnancy test, LMP was 07/18/2024. She has had abdominal cramping that she states is similar to period cramps. Her dizziness has been occurring off and on for the past 2 weeks. It does not occur daily. She has not noted any particular aggravating or alleviating factors. She is followed by cardiology for episodic palpitations and shortness of breath. Their workup has included stress test, echocardiogram, CT cardiac score, and 2-week ZIO monitor. She did not show up for her echocardiogram or stress test appointments. There were not significant abnormalities on ZIO monitor.  She is not established with OBGYN.  Due to language barrier, a virtual interpreter was present during the history-taking, physical exam, and subsequent discussion with this patient.    Pertinent items noted in HPI and remainder of comprehensive ROS otherwise negative.   O BP 113/65 (BP Location: Right Arm)   Pulse 72   Temp 98.1 F (36.7 C) (Oral)   Resp 19   Ht 5' 10 (1.778 m)   Wt 76.8 kg   LMP 07/18/2024   SpO2 100%   BMI 24.29 kg/m  Physical Exam Vitals reviewed.  Constitutional:      General: She is not in acute distress.    Appearance: She is well-developed. She is not diaphoretic.  Eyes:     General: No scleral icterus. Pulmonary:     Effort: Pulmonary effort is normal. No respiratory distress.  Skin:    General: Skin is warm and dry.  Neurological:     Mental Status: She is alert.     Coordination: Coordination normal.    Results for orders placed or performed during the hospital encounter of 08/25/24 (from the past 24 hours)  Pregnancy, urine POC     Status: Abnormal   Collection Time: 08/25/24 11:42 AM  Result Value Ref Range   Preg Test, Ur POSITIVE (A) NEGATIVE  Wet prep, genital     Status: Abnormal    Collection Time: 08/25/24 11:53 AM   Specimen: PATH Cytology Cervicovaginal Ancillary Only  Result Value Ref Range   Yeast Wet Prep HPF POC NONE SEEN NONE SEEN   Trich, Wet Prep NONE SEEN NONE SEEN   Clue Cells Wet Prep HPF POC PRESENT (A) NONE SEEN   WBC, Wet Prep HPF POC <10 <10   Sperm NONE SEEN   CBC     Status: Abnormal   Collection Time: 08/25/24 12:04 PM  Result Value Ref Range   WBC 5.5 4.0 - 10.5 K/uL   RBC 4.02 3.87 - 5.11 MIL/uL   Hemoglobin 12.3 12.0 - 15.0 g/dL   HCT 64.1 (L) 63.9 - 53.9 %   MCV 89.1 80.0 - 100.0 fL   MCH 30.6 26.0 - 34.0 pg   MCHC 34.4 30.0 - 36.0 g/dL   RDW 86.6 88.4 - 84.4 %   Platelets 254 150 - 400 K/uL   nRBC 0.0 0.0 - 0.2 %  hCG, quantitative, pregnancy     Status: Abnormal   Collection Time: 08/25/24 12:04 PM  Result Value Ref Range   hCG, Beta Chain, Quant, S 9,934 (H) <5 mIU/mL  Urinalysis, Routine w reflex microscopic -Urine, Clean Catch     Status: Abnormal   Collection Time: 08/25/24 12:43 PM  Result Value Ref Range   Color, Urine YELLOW YELLOW  APPearance CLEAR CLEAR   Specific Gravity, Urine <1.005 (L) 1.005 - 1.030   pH 6.0 5.0 - 8.0   Glucose, UA NEGATIVE NEGATIVE mg/dL   Hgb urine dipstick TRACE (A) NEGATIVE   Bilirubin Urine NEGATIVE NEGATIVE   Ketones, ur NEGATIVE NEGATIVE mg/dL   Protein, ur NEGATIVE NEGATIVE mg/dL   Nitrite NEGATIVE NEGATIVE   Leukocytes,Ua TRACE (A) NEGATIVE  Urinalysis, Microscopic (reflex)     Status: Abnormal   Collection Time: 08/25/24 12:43 PM  Result Value Ref Range   RBC / HPF 0-5 0 - 5 RBC/hpf   WBC, UA 0-5 0 - 5 WBC/hpf   Bacteria, UA RARE (A) NONE SEEN   Squamous Epithelial / HPF 0-5 0 - 5 /HPF    MDM: Moderate MAU Course: -urine pregnancy test positive. -Ectopic rule out: CBC, US , beta-hCG. Blood type already on file. -Wet prep, UA, and GC/chlamydia to rule out infectious causes. -CBC to rule out anemia that may be contributing to dizziness. -Wet prep positive for BV, will treat  with metronidazole. -CBC within normal limits. -US  shows IUGS. Plan for viability scan in 10 days. Message sent to [facility] to contact patient to schedule.  A 1. Bacterial vaginosis in pregnancy (Primary) - Discharge patient  2. [redacted] weeks gestation of pregnancy - Discharge patient - US  OB Transvaginal; Future  3. Pregnancy with inconclusive fetal viability, single or unspecified fetus - Discharge patient - US  OB Transvaginal; Future  Medical screening exam complete  P Discharge from MAU in stable condition with return precautions Follow up at [facility] for viability scan in approximately 10 days. Message sent to staff at [facility] to schedule. Confirmed that patient phone number is up to date. Discussed that previous labs have occasionally shown hypoglycemia. Recommend eating high protein snacks every 2-3 hours to maintain blood sugar levels, and increasing fluid intake with goal of at least 64 ounces each day. Recommend to continue follow up with cardiology, call to reschedule echo and stress test. Phone number for cardiologist provided in AVS. Discussed that if workup continues to be negative for cardiac abnormalities, dizziness may be related to hormonal fluctuations of early pregnancy.  Future Appointments  Date Time Provider Department Center  09/02/2024 10:30 AM [facility], Jon Garre, PA [facility]   Allergies as of 08/25/2024   No Known Allergies      Medication List     TAKE these medications    metroNIDAZOLE 500 MG tablet Commonly known as: FLAGYL Take 1 tablet (500 mg total) by mouth 2 (two) times daily for 7 days.        Lauren DELENA Sear, PA

## 2024-08-26 LAB — GC/CHLAMYDIA PROBE AMP (~~LOC~~) NOT AT ARMC
Chlamydia: NEGATIVE
Comment: NEGATIVE
Comment: NORMAL
Neisseria Gonorrhea: NEGATIVE

## 2024-08-31 NOTE — Progress Notes (Deleted)
 Cardiology Office Note:    Date:  08/31/2024   ID:  Chandel Zaun, DOB [birth year], MRN [id number]  PCP:  Alba Sharper, MD   [facility] Providers Cardiologist:  Newman JINNY Lawrence, MD { Click to update primary MD,subspecialty MD or APP then REFRESH:1}    Referring MD: Alba Sharper, MD   No chief complaint on file. ***  History of Present Illness:    Lauren Ellis is a 44 y.o. female with a hx of palpitations. She established with cardiology for palpitations, chest pain.  Echo 09/2022 demonstrated LVEF 55-60%, normal PASP, and no significant valvular disease.  She was seen 06/03/2024 and a heart monitor was ordered.  Heart monitor showed 1 short episode of atrial tachycardia but no A-fib/flutter/high-grade AV block.  Patient triggered events correlated with sinus rhythm.  A coronary calcium score was ordered as well as a POET and an echocardiogram, these have not been completed.    She presents today for cardiology follow-up.    Palpitations Paroxysmal atrial tachycardia   Chest pain       Past Medical History:  Diagnosis Date   Anxiety    Anxiety 08/30/2021   Chronic lower back pain    occasional sciatica on right   Gestational diabetes    Language barrier 07/27/2022   Left breast mass 01/25/2021   Left Breast Mammogram/US  (03/2021):  FINDINGS:  There are no masses, areas of architectural distortion, areas of significant asymmetry or suspicious calcifications.     Targeted ultrasound is performed, showing an area normal tissue, with a somewhat prominent fat lobule, in the left breast at 8 o'clock, 9 cm the nipple, corresponding to the area of abnormality. No defined mass or cyst. No suspiciou   Palpitations    Scoliosis    Scoliosis of lumbar spine 08/30/2021   Thoracic disc herniation     Past Surgical History:  Procedure Laterality Date   CESAREAN SECTION      Current Medications: No outpatient medications have been marked as taking for  the 09/02/24 encounter (Appointment) with Madie Jon Garre, PA.     Allergies:   Patient has no known allergies.   Social History   Socioeconomic History   Marital status: Married    Spouse name: Muhsin Financial Controller   Number of children: 4   Years of education: Not on file   Highest education level: 3rd grade  Occupational History   Occupation: stay at home mom  Tobacco Use   Smoking status: Never   Smokeless tobacco: Never  Vaping Use   Vaping status: Never Used  Substance and Sexual Activity   Alcohol use: Never   Drug use: Never   Sexual activity: Yes    Birth control/protection: Condom  Other Topics Concern   Not on file  Social History Narrative   Here with husband and 4 children   Social Drivers of Corporate Investment Banker Strain: Not on file  Food Insecurity: Food Insecurity Present (02/26/2023)   Hunger Vital Sign    Worried About Running Out of Food in the Last Year: Sometimes true    Ran Out of Food in the Last Year: Sometimes true  Transportation Needs: No Transportation Needs (02/26/2023)   PRAPARE - Administrator, Civil Service (Medical): No    Lack of Transportation (Non-Medical): No  Physical Activity: Not on file  Stress: Not on file  Social Connections: Not on file     Family History: The patient's ***family history is not on  file.  ROS:   Please see the history of present illness.    *** All other systems reviewed and are negative.  EKGs/Labs/Other Studies Reviewed:    The following studies were reviewed today: ***      Recent Labs: 08/25/2024: Hemoglobin 12.3; Platelets 254  Recent Lipid Panel    Component Value Date/Time   CHOL 171 01/25/2021 1429   TRIG 28 01/25/2021 1429   HDL 62 01/25/2021 1429   CHOLHDL 2.8 01/25/2021 1429   LDLCALC 102 (H) 01/25/2021 1429     Risk Assessment/Calculations:   {Does this patient have ATRIAL FIBRILLATION?[phone]}  No BP recorded.  {Refresh Note OR Click here to enter BP   :1}***         Physical Exam:    VS:  LMP 07/18/2024     Wt Readings from Last 3 Encounters:  08/25/24 169 lb 4.8 oz (76.8 kg)  06/03/24 171 lb (77.6 kg)  04/30/23 158 lb (71.7 kg)     GEN: *** Well nourished, well developed in no acute distress HEENT: Normal NECK: No JVD; No carotid bruits LYMPHATICS: No lymphadenopathy CARDIAC: ***RRR, no murmurs, rubs, gallops RESPIRATORY:  Clear to auscultation without rales, wheezing or rhonchi  ABDOMEN: Soft, non-tender, non-distended MUSCULOSKELETAL:  No edema; No deformity  SKIN: Warm and dry NEUROLOGIC:  Alert and oriented x 3 PSYCHIATRIC:  Normal affect   ASSESSMENT:    No diagnosis found. PLAN:    In order of problems listed above:  ***      {Are you ordering a CV Procedure (e.g. stress test, cath, DCCV, TEE, etc)?   Press F2        :[id number]}    Medication Adjustments/Labs and Tests Ordered: Current medicines are reviewed at length with the patient today.  Concerns regarding medicines are outlined above.  No orders of the defined types were placed in this encounter.  No orders of the defined types were placed in this encounter.   There are no Patient Instructions on file for this visit.   Signed, Jon Nat Hails, [place]  08/31/2024 12:07 PM    [facility]

## 2024-09-02 ENCOUNTER — Ambulatory Visit: Attending: Physician Assistant | Admitting: Physician Assistant

## 2024-09-08 ENCOUNTER — Other Ambulatory Visit

## 2024-09-15 ENCOUNTER — Other Ambulatory Visit

## 2024-09-15 ENCOUNTER — Telehealth: Payer: Self-pay | Admitting: Family Medicine

## 2024-09-15 ENCOUNTER — Other Ambulatory Visit (HOSPITAL_COMMUNITY): Payer: Self-pay | Admitting: Family Medicine

## 2024-09-15 ENCOUNTER — Other Ambulatory Visit: Payer: Self-pay

## 2024-09-15 DIAGNOSIS — Z3A01 Less than 8 weeks gestation of pregnancy: Secondary | ICD-10-CM

## 2024-09-15 DIAGNOSIS — O219 Vomiting of pregnancy, unspecified: Secondary | ICD-10-CM

## 2024-09-15 DIAGNOSIS — B9689 Other specified bacterial agents as the cause of diseases classified elsewhere: Secondary | ICD-10-CM

## 2024-09-15 DIAGNOSIS — Z3491 Encounter for supervision of normal pregnancy, unspecified, first trimester: Secondary | ICD-10-CM | POA: Diagnosis not present

## 2024-09-15 DIAGNOSIS — O3680X0 Pregnancy with inconclusive fetal viability, not applicable or unspecified: Secondary | ICD-10-CM

## 2024-09-15 DIAGNOSIS — Z3A08 8 weeks gestation of pregnancy: Secondary | ICD-10-CM | POA: Diagnosis not present

## 2024-09-15 MED ORDER — PROMETHAZINE HCL 25 MG PO TABS: 25.0000 mg | ORAL_TABLET | Freq: Four times a day (QID) | ORAL | 0 refills | Status: AC | PRN

## 2024-09-15 MED ORDER — ONDANSETRON HCL 4 MG PO TABS: 4.0000 mg | ORAL_TABLET | Freq: Three times a day (TID) | ORAL | 0 refills | Status: DC | PRN

## 2024-09-15 NOTE — Telephone Encounter (Signed)
 Patient came in for US  today and said she is nauseous and is requesting medicine be sent to her pharmacy.

## 2024-09-15 NOTE — Telephone Encounter (Signed)
 Patient want to get something for nausea

## 2024-09-15 NOTE — Telephone Encounter (Signed)
 Addressed in another encounter.   Cyndee, Charity fundraiser

## 2024-10-14 ENCOUNTER — Ambulatory Visit (INDEPENDENT_AMBULATORY_CARE_PROVIDER_SITE_OTHER)

## 2024-10-14 VITALS — BP 107/71 | HR 73 | Wt 163.8 lb

## 2024-10-14 DIAGNOSIS — O0991 Supervision of high risk pregnancy, unspecified, first trimester: Secondary | ICD-10-CM | POA: Diagnosis not present

## 2024-10-14 DIAGNOSIS — Z3A12 12 weeks gestation of pregnancy: Secondary | ICD-10-CM | POA: Diagnosis not present

## 2024-10-14 DIAGNOSIS — O0990 Supervision of high risk pregnancy, unspecified, unspecified trimester: Secondary | ICD-10-CM | POA: Insufficient documentation

## 2024-10-14 MED ORDER — PRENATAL VITAMIN 27-0.8 MG PO TABS: 1.0000 | ORAL_TABLET | Freq: Every day | ORAL | 11 refills | Status: DC

## 2024-10-14 MED ORDER — METRONIDAZOLE 0.75 % VA GEL: 1.0000 | Freq: Every evening | VAGINAL | 0 refills | Status: AC

## 2024-10-14 MED ORDER — BLOOD PRESSURE KIT DEVI: 1.0000 | 0 refills | Status: AC | PRN

## 2024-10-14 NOTE — Patient Instructions (Addendum)

## 2024-10-14 NOTE — Progress Notes (Addendum)
 New OB Intake  I connected with Lauren Ellis  on 10/14/2024 at 11:15 AM EST in person and verified that I am speaking with the correct person using two identifiers. Nurse is located at [facility] and pt is located at [facility].  I discussed the limitations, risks, security and privacy concerns of performing an evaluation and management service by telephone and the availability of in person appointments. I also discussed with the patient that there may be a patient responsible charge related to this service. The patient expressed understanding and agreed to proceed.  I explained I am completing New OB Intake today. We discussed EDD of 04/24/2025 based on LMP of 07/18/2024. Pt is G6P5005. I reviewed her allergies, medications and Medical/Surgical/OB history.    Patient Active Problem List   Diagnosis Date Noted   Supervision of high risk pregnancy, antepartum 10/14/2024   Language barrier 07/27/2022   Anxiety 08/30/2021     Concerns addressed today  Delivery Plans Plans to deliver at [facility]. Discussed the nature of our practice with multiple providers including residents and students as well as female and female providers. Due to the size of the practice, the delivering provider may not be the same as those providing prenatal care.   Patient is not interested in water birth.  MyChart/Babyscripts MyChart access verified. I explained pt will have some visits in office and some virtually. Babyscripts instructions given and order placed. Patient verifies receipt of registration text/e-mail. Account successfully created and app downloaded. If patient is a candidate for Optimized scheduling, add to sticky note.   Blood Pressure Cuff/Weight Scale Blood pressure cuff ordered for patient to pick-up from [employer]. Explained after first prenatal appt pt will check weekly and document in Babyscripts.   Anatomy US  Explained first scheduled US  will be around 19 weeks. Unable to reach MFM schedulers.  Message sent to have patient scheduled for anatomy US . Give patient appointment date at new ob appt.  Is patient a CenteringPregnancy candidate?  Not a Candidate Not a candidate due to Language barrier   Is patient a Mom+Baby Combined Care candidate?  Not a candidate   If accepted, confirm patient does not intend to move from the area for at least 12 months, then notify Mom+Baby staff  Is patient a candidate for Babyscripts Optimization? No, due to HX/language barrier   First visit review I reviewed new OB appt with patient. Explained pt will be seen by Dr. Fredirick at first visit. Discussed Jenn[place] genetic screening with patient. wants Panorama . Routine prenatal labs is needed at new ob appt. Patient requested to have all labs drawn at the same time at new ob appointment.  Last Pap No results found for: DIAGPAP  [name], CMA 10/14/2024  11:39 AM

## 2024-10-15 ENCOUNTER — Encounter: Payer: Self-pay | Admitting: *Deleted

## 2024-10-20 ENCOUNTER — Ambulatory Visit: Payer: Self-pay | Admitting: Family Medicine

## 2024-10-20 ENCOUNTER — Encounter: Payer: Self-pay | Admitting: General Practice

## 2024-10-20 ENCOUNTER — Other Ambulatory Visit: Payer: Self-pay

## 2024-10-20 ENCOUNTER — Other Ambulatory Visit (HOSPITAL_COMMUNITY)
Admission: RE | Admit: 2024-10-20 | Discharge: 2024-10-20 | Disposition: A | Discharge status: Home / Self Care | Source: Ambulatory Visit | Attending: Family Medicine | Admitting: Family Medicine

## 2024-10-20 ENCOUNTER — Encounter: Payer: Self-pay | Admitting: Family Medicine

## 2024-10-20 VITALS — BP 110/55 | HR 69 | Wt 166.0 lb

## 2024-10-20 DIAGNOSIS — O0991 Supervision of high risk pregnancy, unspecified, first trimester: Secondary | ICD-10-CM

## 2024-10-20 DIAGNOSIS — Z131 Encounter for screening for diabetes mellitus: Secondary | ICD-10-CM | POA: Diagnosis not present

## 2024-10-20 DIAGNOSIS — Z3A13 13 weeks gestation of pregnancy: Secondary | ICD-10-CM

## 2024-10-20 DIAGNOSIS — N898 Other specified noninflammatory disorders of vagina: Secondary | ICD-10-CM | POA: Diagnosis present

## 2024-10-20 DIAGNOSIS — Z8632 Personal history of gestational diabetes: Secondary | ICD-10-CM | POA: Diagnosis not present

## 2024-10-20 DIAGNOSIS — O09521 Supervision of elderly multigravida, first trimester: Secondary | ICD-10-CM | POA: Diagnosis not present

## 2024-10-20 DIAGNOSIS — Z98891 History of uterine scar from previous surgery: Secondary | ICD-10-CM

## 2024-10-20 DIAGNOSIS — Z641 Problems related to multiparity: Secondary | ICD-10-CM | POA: Diagnosis not present

## 2024-10-20 DIAGNOSIS — O0990 Supervision of high risk pregnancy, unspecified, unspecified trimester: Secondary | ICD-10-CM

## 2024-10-20 DIAGNOSIS — Z603 Acculturation difficulty: Secondary | ICD-10-CM

## 2024-10-20 DIAGNOSIS — Z758 Other problems related to medical facilities and other health care: Secondary | ICD-10-CM

## 2024-10-20 DIAGNOSIS — F419 Anxiety disorder, unspecified: Secondary | ICD-10-CM | POA: Diagnosis not present

## 2024-10-20 DIAGNOSIS — O09523 Supervision of elderly multigravida, third trimester: Secondary | ICD-10-CM

## 2024-10-20 LAB — HEMOGLOBIN A1C
Est. average glucose Bld gHb Est-mCnc: 117 mg/dL
Hgb A1c MFr Bld: 5.7 % — ABNORMAL HIGH (ref 4.8–5.6)

## 2024-10-20 MED ORDER — PRENATAL VITAMIN 27-0.8 MG PO TABS: 1.0000 | ORAL_TABLET | Freq: Every day | ORAL | 3 refills | Status: AC

## 2024-10-20 MED ORDER — ASPIRIN 81 MG PO TBEC: 81.0000 mg | DELAYED_RELEASE_TABLET | Freq: Every day | ORAL | 3 refills | Status: AC

## 2024-10-20 NOTE — Progress Notes (Signed)
 "    Subjective:   Lauren Ellis is a 44 y.o. G6P5005 at [redacted]w[redacted]d by LMP, early ultrasound being seen today for her first obstetrical visit.  Her obstetrical history is significant for advanced maternal age and grandmultiparity, prev C-s x 1 with VBAC x 4. Patient does intend to breast feed. Pregnancy history fully reviewed.  Patient reports nausea and vomiting.  HISTORY: OB History  Gravida Para Term Preterm AB Living  6 5 5  0 0 5  SAB IAB Ectopic Multiple Live Births  0 0 0 0 5    # Outcome Date GA Lbr Len/2nd Weight Sex Type Anes PTL Lv  6 Current           5 Term 02/26/23 [redacted]w[redacted]d / 00:08 6 lb 14 oz (3.118 kg) F Vag-Spont EPI  LIV     Name: [facility]     Apgar1: 8  Apgar5: 9  4 Term 11/26/19 [redacted]w[redacted]d  8 lb 9.6 oz (3.9 kg) F VBAC Other  LIV     Birth Comments: wnl  3 Term 11/04/17 [redacted]w[redacted]d  8 lb 9.6 oz (3.9 kg) M VBAC Other  LIV     Birth Comments: wnl  2 Term 05/30/15 [redacted]w[redacted]d  7 lb 11.5 oz (3.5 kg) M VBAC Other  LIV     Birth Comments: wnl  1 Term 03/27/13 [redacted]w[redacted]d  6 lb 9.8 oz (3 kg) F CS-Unspec Other  LIV     Birth Comments: wnl   Last pap smear was  this year at [facility]  Past Medical History:  Diagnosis Date   Anxiety    Anxiety 08/30/2021   Candidiasis of vagina 07/17/2024   Chronic lower back pain    occasional sciatica on right   Encounter for Papanicolaou cervical smear to confirm findings of recent normal smear following initial abnormal smear 07/14/2024   GDM (gestational diabetes mellitus) 01/16/2023   Gestational diabetes    History of C-section 07/27/2022   History of VBAC 11/21/2022   Desires TOLAC, consented on 12/19/22     Language barrier 07/27/2022   Left breast mass 01/25/2021   Left Breast Mammogram/US  (03/2021):  FINDINGS:  There are no masses, areas of architectural distortion, areas of significant asymmetry or suspicious calcifications.     Targeted ultrasound is performed, showing an area normal tissue, with a somewhat prominent fat lobule, in the  left breast at 8 o'clock, 9 cm the nipple, corresponding to the area of abnormality. No defined mass or cyst. No suspiciou   Palpitations    Possible pregnancy 07/14/2024   Postpartum care following vaginal delivery 04/30/2023   Scoliosis    Scoliosis of lumbar spine 08/30/2021   Thoracic disc herniation    Vaginal irritation 07/14/2024   Venereal disease screening 01/24/2021   Past Surgical History:  Procedure Laterality Date   CESAREAN SECTION     History reviewed. No pertinent family history. Social History[1] Allergies[2] Medications Ordered Prior to Encounter[3]   Exam   Vitals:   10/20/24 0943  BP: (!) 110/55  Pulse: 69  Weight: 166 lb (75.3 kg)   Fetal Heart Rate (bpm): 152  System: General: well-developed, well-nourished female in no acute distress   Skin: normal coloration and turgor, no rashes   Neurologic: oriented, normal, negative, normal mood   Extremities: normal strength, tone, and muscle mass, ROM of all joints is normal   HEENT PERRLA, extraocular movement intact and sclera clear, anicteric   Mouth/Teeth mucous membranes moist, pharynx normal without lesions and dental  hygiene good   Neck supple and no masses   Cardiovascular: regular rate and rhythm   Respiratory:  no respiratory distress, normal breath sounds   Abdomen: soft, non-tender; bowel sounds normal; no masses,  no organomegaly      10/20/2024    9:42 AM 10/14/2024   11:45 AM 07/27/2022   11:06 AM  GAD 7 : Generalized Anxiety Score  Nervous, Anxious, on Edge 0 0 0  Control/stop worrying 0 0 0  Worry too much - different things 0 0 0  Trouble relaxing 0 0 0  Restless 0 0 0  Easily annoyed or irritable 0 0 0  Afraid - awful might happen 0 0 0  Total GAD 7 Score 0 0 0    Flowsheet Row Initial Prenatal [facility]20/20[facility]  PHQ-9 Total Score 1      Assessment:   Pregnancy: [id number] Patient Active Problem List   Diagnosis  Date Noted   History of gestational diabetes mellitus (GDM) 10/20/2024   Grand multiparity 10/20/2024   Supervision of high risk pregnancy, antepartum 10/14/2024   AMA (advanced maternal age) multigravida 40+ 07/27/2022   History of C-section 07/27/2022   Language barrier 07/27/2022   Anxiety 08/30/2021     Plan:  1. Supervision of high risk pregnancy, antepartum (Primary) New OB labs and genetics today - CBC/D/Plt+RPR+Rh+ABO+RubIgG... - Culture, OB Urine - PANORAMA PRENATAL TEST - Prenatal Vit-Fe Fumarate-FA (PRENATAL VITAMIN) 27-0.8 MG TABS; Take 1 tablet by mouth daily.  Dispense: 90 tablet; Refill: 3  2. Language barrier Arabic interpreter: in person used   3. History of gestational diabetes mellitus (GDM) Normal 2 hour pp after last pregnancy Baseline A1C - Hemoglobin A1c  4. History of C-section With prior VBAC x 4  5. Grand multiparity At risk of PPH  6. Vaginal discharge Reports white DC and irritation Check wet prep and treat accordingly - Cervicovaginal ancillary only( [place])  7. Screening for diabetes mellitus - Hemoglobin A1c  8. Multigravida of advanced maternal age in third trimester Genetics and add ASA - PANORAMA PRENATAL TEST - aspirin  EC 81 MG tablet; Take 1 tablet (81 mg total) by mouth daily.  Dispense: 90 tablet; Refill: 3  9. [redacted] weeks gestation of pregnancy   10. Anxiety Not currently being treated and reports no real sx's right now. Nml GAD7   Initial labs drawn. Continue prenatal vitamins. Genetic Screening discussed, NIPS: ordered. Ultrasound discussed; fetal anatomic survey: ordered. Problem list reviewed and updated.  Routine obstetric precautions reviewed. Return in 4 weeks (on 11/17/2024).        [1]  Social History Tobacco Use   Smoking status: Never   Smokeless tobacco: Never  Vaping Use   Vaping status: Never Used  Substance Use Topics   Alcohol use: Never   Drug use: Never  [2] No Known Allergies [3]   Current Outpatient Medications on File Prior to Visit  Medication Sig Dispense Refill   promethazine  (PHENERGAN ) 25 MG tablet Take 1 tablet (25 mg total) by mouth every 6 (six) hours as needed for nausea or vomiting. 30 tablet 0   Blood Pressure Monitoring (BLOOD PRESSURE KIT) DEVI 1 each by Does not apply route as needed. (Patient not taking: Reported on 10/20/2024) 1 each 0   No current facility-administered medications on file prior to visit.   "

## 2024-10-21 ENCOUNTER — Ambulatory Visit: Payer: Self-pay | Admitting: Family Medicine

## 2024-10-21 DIAGNOSIS — O0990 Supervision of high risk pregnancy, unspecified, unspecified trimester: Secondary | ICD-10-CM

## 2024-10-21 LAB — CERVICOVAGINAL ANCILLARY ONLY
Bacterial Vaginitis (gardnerella): NEGATIVE
Candida Glabrata: NEGATIVE
Candida Vaginitis: POSITIVE — AB
Comment: NEGATIVE
Comment: NEGATIVE
Comment: NEGATIVE
Comment: NEGATIVE
Trichomonas: NEGATIVE

## 2024-10-21 LAB — CBC/D/PLT+RPR+RH+ABO+RUBIGG...
Antibody Screen: NEGATIVE
Basophils Absolute: 0 x10E3/uL (ref 0.0–0.2)
Basos: 1 %
EOS (ABSOLUTE): 0.2 x10E3/uL (ref 0.0–0.4)
Eos: 3 %
HCV Ab: NONREACTIVE
HIV Screen 4th Generation wRfx: NONREACTIVE
Hematocrit: 33.7 % — ABNORMAL LOW (ref 34.0–46.6)
Hemoglobin: 11.4 g/dL (ref 11.1–15.9)
Hepatitis B Surface Ag: NEGATIVE
Immature Grans (Abs): 0 x10E3/uL (ref 0.0–0.1)
Immature Granulocytes: 0 %
Lymphocytes Absolute: 1.7 x10E3/uL (ref 0.7–3.1)
Lymphs: 27 %
MCH: 31.7 pg (ref 26.6–33.0)
MCHC: 33.8 g/dL (ref 31.5–35.7)
MCV: 94 fL (ref 79–97)
Monocytes Absolute: 0.4 x10E3/uL (ref 0.1–0.9)
Monocytes: 6 %
Neutrophils Absolute: 4.1 x10E3/uL (ref 1.4–7.0)
Neutrophils: 63 %
Platelets: 237 x10E3/uL (ref 150–450)
RBC: 3.6 x10E6/uL — ABNORMAL LOW (ref 3.77–5.28)
RDW: 13.9 % (ref 11.7–15.4)
RPR Ser Ql: NONREACTIVE
Rh Factor: POSITIVE
Rubella Antibodies, IGG: 21.3 {index}
WBC: 6.5 x10E3/uL (ref 3.4–10.8)

## 2024-10-21 LAB — HCV INTERPRETATION

## 2024-10-21 MED ORDER — MICONAZOLE NITRATE 2 % VA CREA: 1.0000 | TOPICAL_CREAM | Freq: Every day | VAGINAL | 0 refills | Status: AC

## 2024-10-22 ENCOUNTER — Telehealth: Payer: Self-pay

## 2024-10-22 NOTE — Telephone Encounter (Signed)
 Pt did not make 3 month follow up post appointment in 05/2024. Do you want me to bring her in foran appointment? Looks like she has a positive pregnancy status.

## 2024-10-22 NOTE — Telephone Encounter (Signed)
 We can just call her to check on her.  Okay not to come in for appointment unless she is having any recurrent symptoms.

## 2024-10-23 LAB — URINE CULTURE, OB REFLEX

## 2024-10-23 LAB — CULTURE, OB URINE

## 2024-10-23 NOTE — Telephone Encounter (Addendum)
-----   Message from Glenys Birk, MD sent at 10/21/2024  9:15 AM EST ----- Needs early 2 hour.  1/15  1115  Called pt w/[employer] interpreter and informed her of need for early 2hr GTT. Appt scheduled on 1/21 @ 0940. Instructions given for NPO after midnight except she may have sips of water. Pt was also informed of test result showing vaginal yeast. Rx has been sent to pharmacy. Dose instructions given. She voiced understanding of all information and instructions given.

## 2024-10-25 LAB — PANORAMA PRENATAL TEST FULL PANEL:PANORAMA TEST PLUS 5 ADDITIONAL MICRODELETIONS: FETAL FRACTION: 15.6

## 2024-10-28 ENCOUNTER — Other Ambulatory Visit: Payer: Self-pay

## 2024-10-28 DIAGNOSIS — Z8632 Personal history of gestational diabetes: Secondary | ICD-10-CM

## 2024-10-29 ENCOUNTER — Other Ambulatory Visit: Payer: Self-pay

## 2024-10-29 DIAGNOSIS — Z8632 Personal history of gestational diabetes: Secondary | ICD-10-CM

## 2024-10-30 ENCOUNTER — Ambulatory Visit: Payer: Self-pay | Admitting: Family Medicine

## 2024-10-30 DIAGNOSIS — O0990 Supervision of high risk pregnancy, unspecified, unspecified trimester: Secondary | ICD-10-CM

## 2024-10-30 DIAGNOSIS — O24419 Gestational diabetes mellitus in pregnancy, unspecified control: Secondary | ICD-10-CM

## 2024-10-30 LAB — GLUCOSE TOLERANCE, 2 HOURS W/ 1HR
Glucose, 1 hour: 200 mg/dL — ABNORMAL HIGH (ref 70–179)
Glucose, 2 hour: 190 mg/dL — ABNORMAL HIGH (ref 70–152)
Glucose, Fasting: 91 mg/dL (ref 70–91)

## 2024-11-05 NOTE — Telephone Encounter (Signed)
 Used interpretor service and unable to leave a message for patient.

## 2024-11-06 MED ORDER — ACCU-CHEK SOFTCLIX LANCETS MISC: 12 refills | Status: AC

## 2024-11-06 MED ORDER — ACCU-CHEK GUIDE W/DEVICE KIT: 1.0000 | PACK | Freq: Four times a day (QID) | 0 refills | Status: AC

## 2024-11-06 MED ORDER — GLUCOSE BLOOD VI STRP: ORAL_STRIP | 12 refills | Status: AC

## 2024-11-06 NOTE — Telephone Encounter (Addendum)
 Call patient with interpreter ID [phone] regarding recent lab results. Notified patient glucose test showed GDM. Supplies sent to pharmacy and scheduled patient for an appointment on 11/13/24 at 0915. Patient confirmed scheduled appointment and advised to bring supplies to appointment. All questions answered and patient denies any further needs at this time.  Rosaline, RN   ----- Message from Glenys Birk, MD sent at 10/30/2024  8:26 AM EST ----- Has GDM--please arrange education and supplies.

## 2024-11-07 NOTE — Progress Notes (Unsigned)
 Patient was seen for Gestational Diabetes 11/13/2024  Start time 0935 and End time 1015   Estimated due date: 04/24/2025; [redacted]w[redacted]d  Arabic Interpreter from [facility]; #Lauren Ellis  Clinical: Medications: Current Medications[1]  Medical History:  Past Medical History:  Diagnosis Date   Anxiety    Anxiety 08/30/2021   Candidiasis of vagina 07/17/2024   Chronic lower back pain    occasional sciatica on right   Encounter for Papanicolaou cervical smear to confirm findings of recent normal smear following initial abnormal smear 07/14/2024   GDM (gestational diabetes mellitus) 01/16/2023   Gestational diabetes    History of C-section 07/27/2022   History of VBAC 11/21/2022   Desires TOLAC, consented on 12/19/22     Language barrier 07/27/2022   Left breast mass 01/25/2021   Left Breast Mammogram/US  (03/2021):  FINDINGS:  There are no masses, areas of architectural distortion, areas of significant asymmetry or suspicious calcifications.     Targeted ultrasound is performed, showing an area normal tissue, with a somewhat prominent fat lobule, in the left breast at 8 o'clock, 9 cm the nipple, corresponding to the area of abnormality. No defined mass or cyst. No suspiciou   Palpitations    Possible pregnancy 07/14/2024   Postpartum care following vaginal delivery 04/30/2023   Scoliosis    Scoliosis of lumbar spine 08/30/2021   Thoracic disc herniation    Vaginal irritation 07/14/2024   Venereal disease screening 01/24/2021    Labs: OGTT fasting 91, 1 hour 200, 2 hour 190 on 10/29/2024 Lab Results  Component Value Date   HGBA1C 5.7 (H) 10/20/2024   Dietary and Lifestyle History: Pt presents today to her initial assessment late with interpreter, glucometer, lancets, and strips. Pt reports the glucometer present is the one she is used in a previous pregnancy. Pt reports previous GDM managed with oral medications. Pt was scheduled a follow up visit in one week to continue her education.   24 hr Recall:   First Meal: milk, 3 creme filled vanilla cookies or fried dal, milk  Snack:  none Second meal:  ~12 noon: boiled dough, onion, tomoto, beef, okra Snack:  none Third meal:  ~5 pm: rice with chicken Snack:  ~ 9 pm: 1 slice of apple and 4 grapes  Beverages:  water, milk  NUTRITION INTERVENTION  Nutrition education (E-1) on the following topics:   Initial Follow-up  [x]  []  Definition of Gestational Diabetes []  []  Why dietary management is important in controlling blood glucose Poorly controlled diabetes can lead to fetal macrosomia, shoulder dystocia and neurological injuries, stillbirth and neonatal complications including respiratory distress syndrome, hypoglycemia and prolonged NICU admission.  []  []  Effects each nutrient has on blood glucose levels []  []  Simple carbohydrates vs complex carbohydrates []  []  Fluid intake []  []  Creating a balanced meal plan []  []  Carbohydrate counting  [x]  []  When to check blood glucose levels [x]  []  Proper blood glucose monitoring techniques []  []  Effect of stress and stress reduction techniques  []  []  Exercise effect on blood glucose levels, appropriate exercise during pregnancy []  []  Importance of limiting caffeine and abstaining from alcohol and smoking [x]  []  Medications used for blood sugar control during pregnancy [x]  []  Hypoglycemia and rule of 15 []  []  Postpartum self care   Accu chek- Patient has a meter prior to visit. Patient is instructed testing pre breakfast and 2 hours after each meal. CBS: 95 mg/dL reported as fasting per Pt in office today   Patient instructed to monitor glucose levels: QID FBS: 60 - <=  95 mg/dL; 2 hour: <= 879 mg/dL  Patient received handouts: Arabic Nutrition Diabetes and Pregnancy Carbohydrate Counting List Blood glucose log Snack ideas for diabetes during pregnancy Plate Planner  Patient will be seen for follow-up as needed.    [1]  Current Outpatient Medications:    Prenatal Vit-Fe Fumarate-FA  (PRENATAL VITAMIN) 27-0.8 MG TABS, Take 1 tablet by mouth daily., Disp: 90 tablet, Rfl: 3   Accu-Chek Softclix Lancets lancets, Check glucose four times a day, Disp: 100 each, Rfl: 12   aspirin  EC 81 MG tablet, Take 1 tablet (81 mg total) by mouth daily. (Patient not taking: Reported on 11/13/2024), Disp: 90 tablet, Rfl: 3   Blood Glucose Monitoring Suppl (ACCU-CHEK GUIDE) w/Device KIT, 1 kit by Does not apply route in the morning, at noon, in the evening, and at bedtime., Disp: 1 kit, Rfl: 0   Blood Pressure Monitoring (BLOOD PRESSURE KIT) DEVI, 1 each by Does not apply route as needed. (Patient not taking: Reported on 10/20/2024), Disp: 1 each, Rfl: 0   glucose blood test strip, Check blood glucose four times a day, Disp: 100 each, Rfl: 12   miconazole  (MONISTAT  7) 2 % vaginal cream, Place 1 Applicatorful vaginally at bedtime., Disp: 45 g, Rfl: 0   promethazine  (PHENERGAN ) 25 MG tablet, Take 1 tablet (25 mg total) by mouth every 6 (six) hours as needed for nausea or vomiting., Disp: 30 tablet, Rfl: 0

## 2024-11-13 ENCOUNTER — Ambulatory Visit: Payer: Self-pay

## 2024-11-13 DIAGNOSIS — O24419 Gestational diabetes mellitus in pregnancy, unspecified control: Secondary | ICD-10-CM

## 2024-11-13 DIAGNOSIS — Z3A16 16 weeks gestation of pregnancy: Secondary | ICD-10-CM

## 2024-11-13 NOTE — Progress Notes (Unsigned)
 Patient was seen for Gestational Diabetes 2/ ***/2026  Start time *** and End time ***  Estimated due date: 04/24/2025; ***w6d  Arabic Interpreter from [facility]; #Nuha  Clinical: Medications: Current Medications[1]  Medical History:  Past Medical History:  Diagnosis Date   Anxiety    Anxiety 08/30/2021   Candidiasis of vagina 07/17/2024   Chronic lower back pain    occasional sciatica on right   Encounter for Papanicolaou cervical smear to confirm findings of recent normal smear following initial abnormal smear 07/14/2024   GDM (gestational diabetes mellitus) 01/16/2023   Gestational diabetes    History of C-section 07/27/2022   History of VBAC 11/21/2022   Desires TOLAC, consented on 12/19/22     Language barrier 07/27/2022   Left breast mass 01/25/2021   Left Breast Mammogram/US  (03/2021):  FINDINGS:  There are no masses, areas of architectural distortion, areas of significant asymmetry or suspicious calcifications.     Targeted ultrasound is performed, showing an area normal tissue, with a somewhat prominent fat lobule, in the left breast at 8 o'clock, 9 cm the nipple, corresponding to the area of abnormality. No defined mass or cyst. No suspiciou   Palpitations    Possible pregnancy 07/14/2024   Postpartum care following vaginal delivery 04/30/2023   Scoliosis    Scoliosis of lumbar spine 08/30/2021   Thoracic disc herniation    Vaginal irritation 07/14/2024   Venereal disease screening 01/24/2021    Labs: OGTT fasting 91, 1 hour 200, 2 hour 190 on 10/29/2024 Lab Results  Component Value Date   HGBA1C 5.7 (H) 10/20/2024   Dietary and Lifestyle History: Pt presents today to her initial assessment late with interpreter, glucometer, lancets, and strips. Pt reports the glucometer present is the one she is used in a previous pregnancy. Pt reports previous GDM managed with oral medications. Pt was scheduled a follow up visit in one week to continue her education.  *** 24 hr Recall:   First Meal: milk, 3 creme filled vanilla cookies or fried dal, milk  Snack:  none Second meal:  ~12 noon: boiled dough, onion, tomoto, beef, okra Snack:  none Third meal:  ~5 pm: rice with chicken Snack:  ~ 9 pm: 1 slice of apple and 4 grapes  Beverages:  water, milk  NUTRITION INTERVENTION  Nutrition education (E-1) on the following topics:   Initial Follow-up  [x]  []  Definition of Gestational Diabetes []  []  Why dietary management is important in controlling blood glucose Poorly controlled diabetes can lead to fetal macrosomia, shoulder dystocia and neurological injuries, stillbirth and neonatal complications including respiratory distress syndrome, hypoglycemia and prolonged NICU admission.  []  []  Effects each nutrient has on blood glucose levels []  []  Simple carbohydrates vs complex carbohydrates []  []  Fluid intake []  []  Creating a balanced meal plan []  []  Carbohydrate counting  [x]  []  When to check blood glucose levels [x]  []  Proper blood glucose monitoring techniques []  []  Effect of stress and stress reduction techniques  []  []  Exercise effect on blood glucose levels, appropriate exercise during pregnancy []  []  Importance of limiting caffeine and abstaining from alcohol and smoking [x]  []  Medications used for blood sugar control during pregnancy [x]  []  Hypoglycemia and rule of 15 []  []  Postpartum self care   Accu chek- Patient has a meter prior to visit. Patient is instructed testing pre breakfast and 2 hours after each meal. CBS: 95 mg/dL reported as fasting per Pt in office today   Patient instructed to monitor glucose levels: QID FBS: 60 - <=  95 mg/dL; 2 hour: <= 879 mg/dL  Patient received handouts: Arabic Nutrition Diabetes and Pregnancy Carbohydrate Counting List Blood glucose log Snack ideas for diabetes during pregnancy Plate Planner  Patient will be seen for follow-up as needed.     [1]  Current Outpatient Medications:    Accu-Chek Softclix Lancets  lancets, Check glucose four times a day, Disp: 100 each, Rfl: 12   aspirin  EC 81 MG tablet, Take 1 tablet (81 mg total) by mouth daily. (Patient not taking: Reported on 11/13/2024), Disp: 90 tablet, Rfl: 3   Blood Glucose Monitoring Suppl (ACCU-CHEK GUIDE) w/Device KIT, 1 kit by Does not apply route in the morning, at noon, in the evening, and at bedtime., Disp: 1 kit, Rfl: 0   Blood Pressure Monitoring (BLOOD PRESSURE KIT) DEVI, 1 each by Does not apply route as needed. (Patient not taking: Reported on 10/20/2024), Disp: 1 each, Rfl: 0   glucose blood test strip, Check blood glucose four times a day, Disp: 100 each, Rfl: 12   miconazole  (MONISTAT  7) 2 % vaginal cream, Place 1 Applicatorful vaginally at bedtime., Disp: 45 g, Rfl: 0   Prenatal Vit-Fe Fumarate-FA (PRENATAL VITAMIN) 27-0.8 MG TABS, Take 1 tablet by mouth daily., Disp: 90 tablet, Rfl: 3   promethazine  (PHENERGAN ) 25 MG tablet, Take 1 tablet (25 mg total) by mouth every 6 (six) hours as needed for nausea or vomiting., Disp: 30 tablet, Rfl: 0

## 2024-11-19 ENCOUNTER — Encounter: Payer: Self-pay | Admitting: Family Medicine

## 2024-11-20 ENCOUNTER — Other Ambulatory Visit: Payer: Self-pay

## 2024-12-03 ENCOUNTER — Other Ambulatory Visit

## 2024-12-03 ENCOUNTER — Ambulatory Visit
